# Patient Record
Sex: Female | Born: 1985 | Race: Black or African American | Hispanic: No | Marital: Married | Smoking: Never smoker
Health system: Southern US, Community
[De-identification: ages and names within clinical notes are randomized; demographics above are authoritative.]

## PROBLEM LIST (undated history)

## (undated) DIAGNOSIS — M2550 Pain in unspecified joint: Secondary | ICD-10-CM

## (undated) DIAGNOSIS — G43909 Migraine, unspecified, not intractable, without status migrainosus: Secondary | ICD-10-CM

## (undated) DIAGNOSIS — K859 Acute pancreatitis without necrosis or infection, unspecified: Secondary | ICD-10-CM

## (undated) HISTORY — PX: WISDOM TOOTH EXTRACTION: SHX21

## (undated) HISTORY — PX: NO PAST SURGERIES: SHX2092

---

## 2010-03-29 ENCOUNTER — Emergency Department (HOSPITAL_BASED_OUTPATIENT_CLINIC_OR_DEPARTMENT_OTHER): Admission: EM | Admit: 2010-03-29 | Discharge: 2010-03-29 | Payer: Self-pay | Admitting: Emergency Medicine

## 2010-07-31 LAB — URINE MICROSCOPIC-ADD ON

## 2010-07-31 LAB — URINALYSIS, ROUTINE W REFLEX MICROSCOPIC
Bilirubin Urine: NEGATIVE
Hgb urine dipstick: NEGATIVE
Ketones, ur: NEGATIVE mg/dL
Nitrite: NEGATIVE
Specific Gravity, Urine: 1.019 (ref 1.005–1.030)
Urobilinogen, UA: 1 mg/dL (ref 0.0–1.0)
pH: 6.5 (ref 5.0–8.0)

## 2010-07-31 LAB — DIFFERENTIAL
Basophils Absolute: 0.1 10*3/uL (ref 0.0–0.1)
Basophils Relative: 1 % (ref 0–1)
Eosinophils Absolute: 0.1 10*3/uL (ref 0.0–0.7)
Eosinophils Relative: 1 % (ref 0–5)
Lymphs Abs: 3.5 10*3/uL (ref 0.7–4.0)
Neutrophils Relative %: 31 % — ABNORMAL LOW (ref 43–77)

## 2010-07-31 LAB — COMPREHENSIVE METABOLIC PANEL
ALT: 22 U/L (ref 0–35)
AST: 21 U/L (ref 0–37)
Alkaline Phosphatase: 66 U/L (ref 39–117)
CO2: 25 mEq/L (ref 19–32)
Chloride: 108 mEq/L (ref 96–112)
GFR calc Af Amer: >60 mL/min (ref >60)
GFR calc non Af Amer: >60 mL/min (ref >60)
Glucose, Bld: 92 mg/dL (ref 70–99)
Potassium: 4.2 mEq/L (ref 3.5–5.1)
Sodium: 144 mEq/L (ref 135–145)
Total Bilirubin: 0.4 mg/dL (ref 0.3–1.2)

## 2010-07-31 LAB — LIPASE, BLOOD: Lipase: 363 U/L — ABNORMAL HIGH (ref 23–300)

## 2010-07-31 LAB — CBC
HCT: 39 % (ref 36.0–46.0)
Hemoglobin: 12.8 g/dL (ref 12.0–15.0)
MCHC: 32.8 g/dL (ref 30.0–36.0)
RBC: 4.51 MIL/uL (ref 3.87–5.11)

## 2010-07-31 LAB — PREGNANCY, URINE: Preg Test, Ur: NEGATIVE

## 2010-12-23 ENCOUNTER — Emergency Department (INDEPENDENT_AMBULATORY_CARE_PROVIDER_SITE_OTHER): Payer: Medicaid Other

## 2010-12-23 ENCOUNTER — Emergency Department (HOSPITAL_BASED_OUTPATIENT_CLINIC_OR_DEPARTMENT_OTHER)
Admission: EM | Admit: 2010-12-23 | Discharge: 2010-12-23 | Disposition: A | Discharge status: Home / Self Care | Payer: Medicaid Other | Attending: Emergency Medicine | Admitting: Emergency Medicine

## 2010-12-23 ENCOUNTER — Encounter: Payer: Self-pay | Admitting: *Deleted

## 2010-12-23 DIAGNOSIS — N760 Acute vaginitis: Secondary | ICD-10-CM | POA: Insufficient documentation

## 2010-12-23 DIAGNOSIS — I10 Essential (primary) hypertension: Secondary | ICD-10-CM | POA: Insufficient documentation

## 2010-12-23 DIAGNOSIS — R109 Unspecified abdominal pain: Secondary | ICD-10-CM | POA: Insufficient documentation

## 2010-12-23 DIAGNOSIS — Z3490 Encounter for supervision of normal pregnancy, unspecified, unspecified trimester: Secondary | ICD-10-CM

## 2010-12-23 DIAGNOSIS — J45909 Unspecified asthma, uncomplicated: Secondary | ICD-10-CM | POA: Insufficient documentation

## 2010-12-23 DIAGNOSIS — B9689 Other specified bacterial agents as the cause of diseases classified elsewhere: Secondary | ICD-10-CM | POA: Insufficient documentation

## 2010-12-23 DIAGNOSIS — O2390 Unspecified genitourinary tract infection in pregnancy, unspecified trimester: Secondary | ICD-10-CM | POA: Insufficient documentation

## 2010-12-23 DIAGNOSIS — O9989 Other specified diseases and conditions complicating pregnancy, childbirth and the puerperium: Secondary | ICD-10-CM

## 2010-12-23 DIAGNOSIS — A499 Bacterial infection, unspecified: Secondary | ICD-10-CM | POA: Insufficient documentation

## 2010-12-23 DIAGNOSIS — R1032 Left lower quadrant pain: Secondary | ICD-10-CM

## 2010-12-23 HISTORY — DX: Acute pancreatitis without necrosis or infection, unspecified: K85.90

## 2010-12-23 LAB — URINALYSIS, ROUTINE W REFLEX MICROSCOPIC
Ketones, ur: NEGATIVE mg/dL
Nitrite: NEGATIVE
Protein, ur: NEGATIVE mg/dL
pH: 6 (ref 5.0–8.0)

## 2010-12-23 LAB — WET PREP, GENITAL

## 2010-12-23 LAB — URINE MICROSCOPIC-ADD ON

## 2010-12-23 MED ORDER — METRONIDAZOLE 500 MG PO TABS: 500.0000 mg | ORAL_TABLET | Freq: Two times a day (BID) | ORAL | Status: AC

## 2010-12-23 NOTE — ED Notes (Signed)
L side abd pain started Thursday, felt better Friday ,but pain returned, has experienced some n/v

## 2010-12-23 NOTE — ED Provider Notes (Signed)
History     CSN: [id number] Arrival date & time: 12/23/2010  2:43 PM  Chief Complaint  Patient presents with  . Abdominal Pain   HPI Comments: Pt states that the pain has been intermittent:pt states that she hasn't had a period since BJY:NWGNFA any history of ovarian cyst   Patient is a 25 y.o. female presenting with abdominal pain. The history is provided by the patient. No language interpreter was used.  Abdominal Pain The primary symptoms of the illness include abdominal pain and dysuria. The primary symptoms of the illness do not include fever, nausea, vomiting, diarrhea, vaginal discharge or vaginal bleeding. The current episode started more than 2 days ago. The onset of the illness was sudden. The problem has not changed since onset. The dysuria is associated with frequency and urgency. The dysuria is not associated with hematuria.   The patient states that she believes she is currently not pregnant. The patient has not had a change in bowel habit. Additional symptoms associated with the illness include urgency and frequency. Symptoms associated with the illness do not include hematuria or back pain.    Past Medical History  Diagnosis Date  . Asthma   . Hypertension   . Pancreatitis     History reviewed. No pertinent past surgical history.  History reviewed. No pertinent family history.  History  Substance Use Topics  . Smoking status: Never Smoker   . Smokeless tobacco: Not on file  . Alcohol Use:     OB History    Grav Para Term Preterm Abortions TAB SAB Ect Mult Living                  Review of Systems  Constitutional: Negative for fever.  Gastrointestinal: Positive for abdominal pain. Negative for nausea, vomiting and diarrhea.  Genitourinary: Positive for dysuria, urgency and frequency. Negative for hematuria, vaginal bleeding and vaginal discharge.  Musculoskeletal: Negative for back pain.  All other systems reviewed and are negative.    Physical Exam    There were no vitals taken for this visit.  Physical Exam  Nursing note and vitals reviewed. Constitutional: She is oriented to person, place, and time. She appears well-developed and well-nourished.  HENT:  Head: Normocephalic and atraumatic.  Neck: Normal range of motion.  Cardiovascular: Normal rate and regular rhythm.   Pulmonary/Chest: Effort normal and breath sounds normal.  Abdominal: Soft. Bowel sounds are normal.  Genitourinary: Vaginal discharge found.       Pt has a white vag. discharge  Musculoskeletal: Normal range of motion.  Neurological: She is alert and oriented to person, place, and time.  Skin: Skin is warm and dry.  Psychiatric: She has a normal mood and affect.    ED Course  Procedures Results for orders placed during the hospital encounter of 12/23/10  URINALYSIS, ROUTINE W REFLEX MICROSCOPIC      Component Value Range   Color, Urine AMBER (*) YELLOW    Appearance CLOUDY (*) CLEAR    Specific Gravity, Urine 1.031 (*) 1.005 - 1.030    pH 6.0  5.0 - 8.0    Glucose, UA NEGATIVE  NEGATIVE (mg/dL)   Hgb urine dipstick NEGATIVE  NEGATIVE    Bilirubin Urine SMALL (*) NEGATIVE    Ketones, ur NEGATIVE  NEGATIVE (mg/dL)   Protein, ur NEGATIVE  NEGATIVE (mg/dL)   Urobilinogen, UA 1.0  0.0 - 1.0 (mg/dL)   Nitrite NEGATIVE  NEGATIVE    Leukocytes, UA TRACE (*) NEGATIVE   PREGNANCY, URINE  Component Value Range   Preg Test, Ur POSITIVE    WET PREP, GENITAL      Component Value Range   Yeast, Wet Prep NONE SEEN  NONE SEEN    Trich, Wet Prep NONE SEEN  NONE SEEN    Clue Cells, Wet Prep TOO NUMEROUS TO COUNT (*) NONE SEEN    WBC, Wet Prep HPF POC TOO NUMEROUS TO COUNT (*) NONE SEEN   URINE MICROSCOPIC-ADD ON      Component Value Range   Squamous Epithelial / LPF MANY (*) RARE    WBC, UA 3-6  <3 (WBC/hpf)   Bacteria, UA MANY (*) RARE    Urine-Other MUCOUS PRESENT     US Ob Comp Less 14 Wks  12/23/2010  *RADIOLOGY REPORT*  Clinical Data: Left lower  quadrant pain.  Positive pregnancy test. By LMP the patient is 10 weeks 1 day.  Unsure LMP.  EDC by LMP 07/20/2011.  OBSTETRIC <14 WK [place] AND TRANSVAGINAL OB US  Technique:  Both transabdominal and transvaginal ultrasound examinations were performed for complete evaluation of the gestation as well as the maternal uterus, adnexal regions, and pelvic cul-de-sac.  Transvaginal technique was performed to assess early pregnancy.  Comparison:  None.  Intrauterine gestational sac:  Present Yolk sac: Present Embryo: Present Cardiac Activity: Present Heart Rate: 160 bpm  CRL: 10.9   mm  7   w  1   d        [place] EDC: 08/10/2011  Maternal uterus/adnexae: The ovaries have a normal appearance.  No subchorionic hemorrhage identified. Right ovarian corpus luteum cyst present.  IMPRESSION: The single living intrauterine embryo corresponding to an age of 7 weeks 1 day.  This is significantly differ from age determined by last menstrual period.  Ovaries have a normal appearance.  No subchorionic hemorrhage identified.  EDC by ultrasound today is 08/10/2011.  Original Report Authenticated By: Patterson Hammersmith, M.D.   US Ob Transvaginal  12/23/2010  *RADIOLOGY REPORT*  Clinical Data: Left lower quadrant pain.  Positive pregnancy test. By LMP the patient is 10 weeks 1 day.  Unsure LMP.  EDC by LMP 07/20/2011.  OBSTETRIC <14 WK [place] AND TRANSVAGINAL OB US  Technique:  Both transabdominal and transvaginal ultrasound examinations were performed for complete evaluation of the gestation as well as the maternal uterus, adnexal regions, and pelvic cul-de-sac.  Transvaginal technique was performed to assess early pregnancy.  Comparison:  None.  Intrauterine gestational sac:  Present Yolk sac: Present Embryo: Present Cardiac Activity: Present Heart Rate: 160 bpm  CRL: 10.9   mm  7   w  1   d        [place] EDC: 08/10/2011  Maternal uterus/adnexae: The ovaries have a normal appearance.  No subchorionic hemorrhage identified. Right ovarian corpus luteum  cyst present.  IMPRESSION: The single living intrauterine embryo corresponding to an age of 7 weeks 1 day.  This is significantly differ from age determined by last menstrual period.  Ovaries have a normal appearance.  No subchorionic hemorrhage identified.  EDC by ultrasound today is 08/10/2011.  Original Report Authenticated By: Patterson Hammersmith, M.D.    MDM Pt has ZOX:WRUE treat for AV:[id number] for sti and urine sent   Medical screening examination/treatment/procedure(s) were performed by non-physician practitioner and as supervising physician I was immediately available for consultation/collaboration. Osvaldo Human, M.D.  Teressa Lower, NP 12/23/10 1712  Carleene Cooper III, MD 12/24/10 2017

## 2010-12-24 LAB — URINE CULTURE
Colony Count: NO GROWTH
Culture  Setup Time: 201208060014

## 2010-12-24 LAB — GC/CHLAMYDIA PROBE AMP, GENITAL: Chlamydia, DNA Probe: NEGATIVE

## 2010-12-28 ENCOUNTER — Encounter (HOSPITAL_COMMUNITY): Payer: Self-pay | Admitting: *Deleted

## 2010-12-28 ENCOUNTER — Inpatient Hospital Stay (HOSPITAL_COMMUNITY)
Admission: AD | Admit: 2010-12-28 | Discharge: 2010-12-29 | Disposition: A | Discharge status: Home / Self Care | Payer: Medicaid Other | Source: Ambulatory Visit | Attending: Family Medicine | Admitting: Family Medicine

## 2010-12-28 DIAGNOSIS — O99891 Other specified diseases and conditions complicating pregnancy: Secondary | ICD-10-CM | POA: Insufficient documentation

## 2010-12-28 DIAGNOSIS — R109 Unspecified abdominal pain: Secondary | ICD-10-CM | POA: Insufficient documentation

## 2010-12-28 DIAGNOSIS — O26899 Other specified pregnancy related conditions, unspecified trimester: Secondary | ICD-10-CM

## 2010-12-28 HISTORY — DX: Pain in unspecified joint: M25.50

## 2010-12-28 NOTE — Progress Notes (Signed)
Pt states, " I started having pain in my lower left abdomen off and on since last Thursday a week ago. It happened again over the week end, and I went to [facility], and they said I was pregnant, but had BV. They did an [place] and said I had a normal pregnancy. I started having this pain again an hour ago."

## 2010-12-29 ENCOUNTER — Encounter (HOSPITAL_COMMUNITY): Payer: Self-pay

## 2010-12-29 LAB — URINALYSIS, ROUTINE W REFLEX MICROSCOPIC
Ketones, ur: NEGATIVE mg/dL
Nitrite: NEGATIVE
Protein, ur: NEGATIVE mg/dL
pH: 6 (ref 5.0–8.0)

## 2010-12-29 LAB — URINE MICROSCOPIC-ADD ON

## 2010-12-29 MED ORDER — ACETAMINOPHEN 325 MG PO TABS
650.0000 mg | ORAL_TABLET | Freq: Once | ORAL | Status: AC
  Administered 2010-12-29: 650 mg via ORAL
  Filled 2010-12-29: qty 2

## 2010-12-29 MED ORDER — PRENATAL RX 60-1 MG PO TABS: 1.0000 | ORAL_TABLET | Freq: Every day | ORAL | Status: AC

## 2010-12-29 NOTE — Progress Notes (Signed)
Patient is here with c/o that she had to leave work at 2230pm due to sudden onset of sharp stabbing pain in her lower abdominal. She denies any vaginal bleeding or discharge.

## 2010-12-29 NOTE — ED Provider Notes (Addendum)
History     Chief Complaint  Patient presents with  . Abdominal Pain   HPI Kendra Graham 25 y.o. gestation 46w 0d by US done on 12-23-10.  Had full workup done on 12-23-10 due to periodic stabbing lower abdominal pain.  Same pain returned tonight while she was at work.  Has not taken any Tylenol.  Has not started prenatal care.    OB History    Grav Para Term Preterm Abortions TAB SAB Ect Mult Living   1               Past Medical History  Diagnosis Date  . Asthma   . Hypertension   . Pancreatitis   . Arthritic-like pain     History reviewed. No pertinent past surgical history.  Family History  Problem Relation Age of Onset  . Diabetes Mother   . Hyperlipidemia Mother   . Hypertension Mother   . Cancer Father   . Cancer Maternal Grandmother     History  Substance Use Topics  . Smoking status: Never Smoker   . Smokeless tobacco: Not on file  . Alcohol Use: 0.6 oz/week    1 Shots of liquor per week    Allergies:  Allergies  Allergen Reactions  . Vinyl Ether Hives and Rash    Prescriptions prior to admission  Medication Sig Dispense Refill  . metroNIDAZOLE (FLAGYL) 500 MG tablet Take 1 tablet (500 mg total) by mouth 2 (two) times daily.  14 tablet  0    Review of Systems  Gastrointestinal: Positive for abdominal pain.  Genitourinary: Negative for dysuria and urgency.       No vaginal discharge. No vaginal bleeding. No dysuria.    Physical Exam   Blood pressure 123/77, pulse 63, temperature 98.5 F (36.9 C), temperature source Oral, resp. rate 18, height 5\' 2"  (1.575 m), weight 102 lb (46.267 kg), last menstrual period 10/16/2010.  Physical Exam  Nursing note and vitals reviewed. Constitutional: She is oriented to person, place, and time. She appears well-developed and well-nourished.  HENT:  Head: Normocephalic.  Eyes: EOM are normal.  Neck: Neck supple.  GI: Soft. There is no rebound and no guarding.       Low midline tenderness    Musculoskeletal: Normal range of motion.  Neurological: She is alert and oriented to person, place, and time.  Skin: Skin is warm and dry.  Psychiatric: She has a normal mood and affect.    MAU Course  Procedures  MDM Results for orders placed during the hospital encounter of 12/28/10 (from the past 24 hour(s))  URINALYSIS, ROUTINE W REFLEX MICROSCOPIC     Status: Abnormal   Collection Time   12/29/10 12:24 AM      Component Value Range   Color, Urine YELLOW  YELLOW    Appearance CLEAR  CLEAR    Specific Gravity, Urine <1.005 (*) 1.005 - 1.030    pH 6.0  5.0 - 8.0    Glucose, UA NEGATIVE  NEGATIVE (mg/dL)   Hgb urine dipstick NEGATIVE  NEGATIVE    Bilirubin Urine NEGATIVE  NEGATIVE    Ketones, ur NEGATIVE  NEGATIVE (mg/dL)   Protein, ur NEGATIVE  NEGATIVE (mg/dL)   Urobilinogen, UA 0.2  0.0 - 1.0 (mg/dL)   Nitrite NEGATIVE  NEGATIVE    Leukocytes, UA MODERATE (*) NEGATIVE   URINE MICROSCOPIC-ADD ON     Status: Abnormal   Collection Time   12/29/10 12:24 AM      Component Value  Range   Squamous Epithelial / LPF FEW (*) RARE    WBC, UA 3-6  <3 (WBC/hpf)   RBC / HPF 0-2  <3 (RBC/hpf)   Bacteria, UA FEW (*) RARE    *RADIOLOGY REPORT* Done 12-23-10 Clinical Data: Left lower quadrant pain. Positive pregnancy test.  By LMP the patient is 10 weeks 1 day. Unsure LMP. EDC by LMP  07/20/2011.  OBSTETRIC <14 WK [place] AND TRANSVAGINAL OB US  Technique: Both transabdominal and transvaginal ultrasound  examinations were performed for complete evaluation of the  gestation as well as the maternal uterus, adnexal regions, and  pelvic cul-de-sac. Transvaginal technique was performed to assess  early pregnancy.  Comparison: None.  Intrauterine gestational sac: Present  Yolk sac: Present  Embryo: Present  Cardiac Activity: Present  Heart Rate: 160 bpm  CRL: 10.9 mm 7 w 1 d [place] EDC: 08/10/2011  Maternal uterus/adnexae:  The ovaries have a normal appearance. No subchorionic hemorrhage   identified. Right ovarian corpus luteum cyst present.  IMPRESSION:  The single living intrauterine embryo corresponding to an age of 7  weeks 1 day. This is significantly differ from age determined by  last menstrual period. Ovaries have a normal appearance. No  subchorionic hemorrhage identified. EDC by ultrasound today is  08/10/2011.   Assessment and Plan  Abdominal pain in early pregnancy  Plan: Urine culture pending to R/O UTI Tylenol 650 mg PO now. Continue good fluid intake. Begin prenatal care as soon as possible.  BURLESON,TERRI 12/29/2010, 1:01 AM   Nolene Bernheim, NP 12/29/10 0121  Chart reviewed and agree with management and plan.

## 2010-12-29 NOTE — Progress Notes (Signed)
She states that she found out that she was pregnant this week at [facility].

## 2010-12-30 LAB — URINE CULTURE
Colony Count: NO GROWTH
Culture  Setup Time: 201208111242

## 2011-03-21 NOTE — ED Provider Notes (Signed)
thoracic spine

## 2012-07-20 ENCOUNTER — Emergency Department (HOSPITAL_BASED_OUTPATIENT_CLINIC_OR_DEPARTMENT_OTHER)
Admission: EM | Admit: 2012-07-20 | Discharge: 2012-07-20 | Disposition: A | Discharge status: Home / Self Care | Payer: Medicaid Other | Attending: Emergency Medicine | Admitting: Emergency Medicine

## 2012-07-20 ENCOUNTER — Emergency Department (HOSPITAL_BASED_OUTPATIENT_CLINIC_OR_DEPARTMENT_OTHER): Payer: Medicaid Other

## 2012-07-20 ENCOUNTER — Emergency Department (HOSPITAL_COMMUNITY): Payer: Medicaid Other

## 2012-07-20 ENCOUNTER — Encounter (HOSPITAL_BASED_OUTPATIENT_CLINIC_OR_DEPARTMENT_OTHER): Payer: Self-pay | Admitting: *Deleted

## 2012-07-20 DIAGNOSIS — J45909 Unspecified asthma, uncomplicated: Secondary | ICD-10-CM | POA: Insufficient documentation

## 2012-07-20 DIAGNOSIS — S0990XA Unspecified injury of head, initial encounter: Secondary | ICD-10-CM | POA: Insufficient documentation

## 2012-07-20 DIAGNOSIS — W108XXA Fall (on) (from) other stairs and steps, initial encounter: Secondary | ICD-10-CM | POA: Insufficient documentation

## 2012-07-20 DIAGNOSIS — Z8719 Personal history of other diseases of the digestive system: Secondary | ICD-10-CM | POA: Insufficient documentation

## 2012-07-20 DIAGNOSIS — Y92009 Unspecified place in unspecified non-institutional (private) residence as the place of occurrence of the external cause: Secondary | ICD-10-CM

## 2012-07-20 DIAGNOSIS — S7002XA Contusion of left hip, initial encounter: Secondary | ICD-10-CM

## 2012-07-20 DIAGNOSIS — Y9289 Other specified places as the place of occurrence of the external cause: Secondary | ICD-10-CM | POA: Insufficient documentation

## 2012-07-20 DIAGNOSIS — Y9389 Activity, other specified: Secondary | ICD-10-CM | POA: Insufficient documentation

## 2012-07-20 DIAGNOSIS — S7000XA Contusion of unspecified hip, initial encounter: Secondary | ICD-10-CM | POA: Insufficient documentation

## 2012-07-20 DIAGNOSIS — I10 Essential (primary) hypertension: Secondary | ICD-10-CM | POA: Insufficient documentation

## 2012-07-20 DIAGNOSIS — W19XXXA Unspecified fall, initial encounter: Secondary | ICD-10-CM

## 2012-07-20 MED ORDER — OXYCODONE-ACETAMINOPHEN 5-325 MG PO TABS
2.0000 | ORAL_TABLET | Freq: Once | ORAL | Status: AC
  Administered 2012-07-20: 2 via ORAL
  Filled 2012-07-20 (×2): qty 2

## 2012-07-20 NOTE — ED Notes (Signed)
Pt to room 3 in w/c, able to stand and walk to bed with cg@. Pt reports "I missed a step this morning, and fell down my steps..." pt states she hit her left hip and leg, and hit her posterior head on steps. No lacerations, abrasions, or swelling noted to head, slightly tender to palp.

## 2012-07-20 NOTE — ED Provider Notes (Signed)
History     CSN: [id number]  Arrival date & time 07/20/12  2595   First MD Initiated Contact with Patient 07/20/12 [phone]      Chief Complaint  Patient presents with  . Fall  . Headache    (Consider location/radiation/quality/duration/timing/severity/associated sxs/prior treatment) HPI Kendra Graham who comes in today stating that she fell down the steps approximately one and half hours prior to admission. She states it is in a third floor apartment and fell at least part of the way down to the second floor landing. It was a trip and fall without loss of consciousness. She states she struck her head and has a headache. She is also complaining of some pain in her left hip. He she is unclear she had loss of consciousness and she was alone. She called her sister to come and get her. She has been ambulatory since. She denies any numbness, tingling, weakness, or focal neurological deficit. She has normal vision and has not vomited. Past Medical History  Diagnosis Date  . Asthma   . Hypertension   . Pancreatitis   . Arthritic-like pain     History reviewed. No pertinent past surgical history.  Family History  Problem Relation Age of Onset  . Diabetes Mother   . Hyperlipidemia Mother   . Hypertension Mother   . Cancer Father   . Cancer Maternal Grandmother     History  Substance Use Topics  . Smoking status: Never Smoker   . Smokeless tobacco: Not on file  . Alcohol Use: 0.6 oz/week    1 Shots of liquor per week    OB History   Grav Para Term Preterm Abortions TAB SAB Ect Mult Living   1               Review of Systems  All other systems reviewed and are negative.    Allergies  Vinyl ether  Home Medications  No current outpatient prescriptions on file.  BP 132/81  Pulse 84  Temp(Src) 97.8 F (36.6 C) (Oral)  Resp 16  SpO2 100%  LMP 07/19/2012  Breastfeeding? Unknown  Physical Exam  Nursing note and vitals reviewed. Constitutional: She is oriented to  person, place, and time. She appears well-developed and well-nourished.  HENT:  Head: Normocephalic and atraumatic.  Right Ear: Tympanic membrane, external ear and ear canal normal.  Left Ear: Tympanic membrane, external ear and ear canal normal.  Nose: Nose normal.  Mouth/Throat: Oropharynx is clear and moist.  Eyes: Conjunctivae and EOM are normal. Pupils are equal, round, and reactive to light.  Neck:  Some tenderness noted diffusely over her superior cervical spine  Cardiovascular: Normal rate, regular rhythm, normal heart sounds and intact distal pulses.   Pulmonary/Chest: Effort normal and breath sounds normal.  Abdominal: Soft. Bowel sounds are normal.  Musculoskeletal:  Thoracic and lumbar spine and paraspinal area without tenderness. No signs of trauma on trunk or extremities. She is slightly tender to palpation over the lateral aspect of the left hip and hasn't and Hodge date secondary to this.  Neurological: She is alert and oriented to person, place, and time. She has normal strength and normal reflexes. She displays a negative Romberg sign. Gait abnormal. Coordination normal. GCS eye subscore is 4. GCS verbal subscore is 5. GCS motor subscore is 6.    ED Course  Procedures (including critical care time)  Labs Reviewed - No data to display No results found.   No diagnosis found. Dg Hip Complete Left  07/20/2012  *RADIOLOGY REPORT*  Clinical Data: Fall.  Headache.  LEFT HIP - COMPLETE 2+ VIEW  Comparison: None.  Findings: No acute fracture and no dislocation.  Unremarkable soft tissues.  IMPRESSION: No acute bony pathology.   Original Report Authenticated By: Jolaine Click, M.D.    Ct Head Wo Contrast  07/20/2012  *RADIOLOGY REPORT*  Clinical Data:  Fall down stairs with posterior head injury and posterior neck pain.  CT HEAD WITHOUT CONTRAST CT CERVICAL SPINE WITHOUT CONTRAST  Technique:  Multidetector CT imaging of the head and cervical spine was performed following the  standard protocol without intravenous contrast.  Multiplanar CT image reconstructions of the cervical spine were also generated.  Comparison:   None  CT HEAD  Findings: The brain demonstrates no evidence of hemorrhage, infarction, edema, mass effect, extra-axial fluid collection, hydrocephalus or mass lesion.  The skull is unremarkable.  IMPRESSION: Normal head CT.  CT CERVICAL SPINE  Findings: The cervical spine shows normal alignment and no evidence of fracture or subluxation.  No soft tissue swelling is identified. The visualized airway is unremarkable.  No significant degenerative changes.  No incidental masses or bony lesions.  IMPRESSION: Normal CT of cervical spine.   Original Report Authenticated By: Irish Lack, M.D.    Ct Cervical Spine Wo Contrast  07/20/2012  *RADIOLOGY REPORT*  Clinical Data:  Fall down stairs with posterior head injury and posterior neck pain.  CT HEAD WITHOUT CONTRAST CT CERVICAL SPINE WITHOUT CONTRAST  Technique:  Multidetector CT imaging of the head and cervical spine was performed following the standard protocol without intravenous contrast.  Multiplanar CT image reconstructions of the cervical spine were also generated.  Comparison:   None  CT HEAD  Findings: The brain demonstrates no evidence of hemorrhage, infarction, edema, mass effect, extra-axial fluid collection, hydrocephalus or mass lesion.  The skull is unremarkable.  IMPRESSION: Normal head CT.  CT CERVICAL SPINE  Findings: The cervical spine shows normal alignment and no evidence of fracture or subluxation.  No soft tissue swelling is identified. The visualized airway is unremarkable.  No significant degenerative changes.  No incidental masses or bony lesions.  IMPRESSION: Normal CT of cervical spine.   Original Report Authenticated By: Irish Lack, M.D.    MDM  Patient will have CT of head and neck x-ray of left hip. She is given 2 Percocet here for pain. CT is negative and x-ray of left hip is negative.  Patient is given head injury precautions. She's advised return if she has any symptoms of increased weakness, increased drowsiness decreased arousability, difficulty with gait. Learta Codding, MD 07/20/12 [phone]

## 2013-04-12 ENCOUNTER — Encounter (HOSPITAL_BASED_OUTPATIENT_CLINIC_OR_DEPARTMENT_OTHER): Payer: Self-pay | Admitting: Emergency Medicine

## 2013-04-12 ENCOUNTER — Emergency Department (HOSPITAL_BASED_OUTPATIENT_CLINIC_OR_DEPARTMENT_OTHER)
Admission: EM | Admit: 2013-04-12 | Discharge: 2013-04-12 | Disposition: A | Discharge status: Home / Self Care | Payer: Medicaid Other | Attending: Emergency Medicine | Admitting: Emergency Medicine

## 2013-04-12 DIAGNOSIS — R197 Diarrhea, unspecified: Secondary | ICD-10-CM | POA: Insufficient documentation

## 2013-04-12 DIAGNOSIS — Z8719 Personal history of other diseases of the digestive system: Secondary | ICD-10-CM | POA: Insufficient documentation

## 2013-04-12 DIAGNOSIS — R1031 Right lower quadrant pain: Secondary | ICD-10-CM | POA: Insufficient documentation

## 2013-04-12 DIAGNOSIS — J45909 Unspecified asthma, uncomplicated: Secondary | ICD-10-CM | POA: Insufficient documentation

## 2013-04-12 DIAGNOSIS — Z3202 Encounter for pregnancy test, result negative: Secondary | ICD-10-CM | POA: Insufficient documentation

## 2013-04-12 LAB — URINALYSIS, ROUTINE W REFLEX MICROSCOPIC
Bilirubin Urine: NEGATIVE
Glucose, UA: NEGATIVE mg/dL
Ketones, ur: NEGATIVE mg/dL
Nitrite: NEGATIVE
Specific Gravity, Urine: 1.021 (ref 1.005–1.030)
pH: 5.5 (ref 5.0–8.0)

## 2013-04-12 LAB — URINE MICROSCOPIC-ADD ON

## 2013-04-12 NOTE — ED Notes (Signed)
C/o of right lower abd pain that started about 2 hours ago. States pain comes and goes. Describes as sharp.  C/o diarrhea times one. Denies fevers. Denies any n/v. Denies any vaginal d/c.

## 2013-04-12 NOTE — ED Provider Notes (Signed)
CSN: [id number]     Arrival date & time 04/12/13  8469 History   First MD Initiated Contact with Patient 04/12/13 0700     Chief Complaint  Patient presents with  . Abdominal Pain   (Consider location/radiation/quality/duration/timing/severity/associated sxs/prior Treatment) Patient is a 27 y.o. female presenting with abdominal pain. The history is provided by the patient.  Abdominal Pain Pain location:  RLQ Pain quality: sharp   Pain radiates to:  Does not radiate Pain severity:  Moderate Onset quality:  Sudden Duration:  2 hours Timing:  Intermittent Progression:  Resolved Chronicity:  New Context: not alcohol use, not diet changes, not eating, not medication withdrawal, not previous surgeries, not recent illness, not recent travel, not retching, not sick contacts, not suspicious food intake and not trauma   Relieved by:  Nothing Worsened by:  Nothing tried Ineffective treatments:  None tried Associated symptoms: diarrhea   Associated symptoms: no chest pain, no chills, no cough, no dysuria, no fatigue, no fever, no hematuria, no nausea, no shortness of breath, no sore throat, no vaginal bleeding, no vaginal discharge and no vomiting   Diarrhea:    Number of occurrences:  1   Severity:  Mild   Duration:  1 hour   Timing:  Rare   Progression:  Unchanged Risk factors: no alcohol abuse, has not had multiple surgeries, no NSAID use, not obese, not pregnant and no recent hospitalization     Past Medical History  Diagnosis Date  . Asthma   . Pancreatitis   . Arthritic-like pain    History reviewed. No pertinent past surgical history. Family History  Problem Relation Age of Onset  . Diabetes Mother   . Hyperlipidemia Mother   . Hypertension Mother   . Cancer Father   . Cancer Maternal Grandmother    History  Substance Use Topics  . Smoking status: Never Smoker   . Smokeless tobacco: Not on file  . Alcohol Use: No   OB History   Grav Para Term Preterm Abortions TAB  SAB Ect Mult Living   1              Review of Systems  Constitutional: Negative for fever, chills, diaphoresis, activity change, appetite change and fatigue.  HENT: Negative for congestion, facial swelling, rhinorrhea and sore throat.   Eyes: Negative for photophobia and discharge.  Respiratory: Negative for cough, chest tightness and shortness of breath.   Cardiovascular: Negative for chest pain, palpitations and leg swelling.  Gastrointestinal: Positive for abdominal pain and diarrhea. Negative for nausea and vomiting.  Endocrine: Negative for polydipsia and polyuria.  Genitourinary: Negative for dysuria, frequency, hematuria, vaginal bleeding, vaginal discharge, difficulty urinating and pelvic pain.  Musculoskeletal: Negative for arthralgias, back pain, neck pain and neck stiffness.  Skin: Negative for color change and wound.  Allergic/Immunologic: Negative for immunocompromised state.  Neurological: Negative for facial asymmetry, weakness, numbness and headaches.  Hematological: Does not bruise/bleed easily.  Psychiatric/Behavioral: Negative for confusion and agitation.    Allergies  Vinyl ether  Home Medications  No current outpatient prescriptions on file. BP 114/73  Pulse 69  Temp(Src) 98.6 F (37 C) (Oral)  Resp 16  SpO2 100%  LMP 03/07/2013 Physical Exam  Constitutional: She is oriented to person, place, and time. She appears well-developed and well-nourished. No distress.  HENT:  Head: Normocephalic and atraumatic.  Mouth/Throat: No oropharyngeal exudate.  Eyes: Pupils are equal, round, and reactive to light.  Neck: Normal range of motion. Neck supple.  Cardiovascular: Normal  rate, regular rhythm and normal heart sounds.  Exam reveals no gallop and no friction rub.   No murmur heard. Pulmonary/Chest: Effort normal and breath sounds normal. No respiratory distress. She has no wheezes. She has no rales.  Abdominal: Soft. Bowel sounds are normal. She exhibits no  distension and no mass. There is no tenderness. There is no rebound and no guarding.  Musculoskeletal: Normal range of motion. She exhibits no edema and no tenderness.  Neurological: She is alert and oriented to person, place, and time.  Skin: Skin is warm and dry.  Psychiatric: She has a normal mood and affect.    ED Course  Procedures (including critical care time) Labs Review Labs Reviewed  URINALYSIS, ROUTINE W REFLEX MICROSCOPIC  PREGNANCY, URINE   Imaging Review No results found.  EKG Interpretation   None       MDM  No diagnosis found. Pt is a 27 y.o. female with Pmhx as above who presents with about 2 hours of intermittent RLQ pain.  No pain currently for last 20 mins.  Had 1 episode of d/a this morning. No fever, n/v, urinary symptoms, vag bleeding or d/c.  LMP about 1 month ago.  On PE VSS, pt in NAD. Abdominal exam benign.  Given reasuring exam, doubt acute surgical emergency such as appendicitis, ovarian torsion.  UA + only for small leuks, POC preg negative.  I feel pt safe for d/c home w/ return precautions for worsening or persistent pain.           Shanna Cisco, MD 04/12/13 [phone]

## 2013-04-13 LAB — URINE CULTURE: Colony Count: >=100000

## 2014-03-21 ENCOUNTER — Encounter (HOSPITAL_BASED_OUTPATIENT_CLINIC_OR_DEPARTMENT_OTHER): Payer: Self-pay | Admitting: Emergency Medicine

## 2014-06-13 ENCOUNTER — Encounter (HOSPITAL_BASED_OUTPATIENT_CLINIC_OR_DEPARTMENT_OTHER): Payer: Self-pay | Admitting: *Deleted

## 2014-06-13 ENCOUNTER — Emergency Department (HOSPITAL_BASED_OUTPATIENT_CLINIC_OR_DEPARTMENT_OTHER)
Admission: EM | Admit: 2014-06-13 | Discharge: 2014-06-13 | Disposition: A | Discharge status: Home / Self Care | Payer: Medicaid Other | Attending: Emergency Medicine | Admitting: Emergency Medicine

## 2014-06-13 ENCOUNTER — Emergency Department (HOSPITAL_BASED_OUTPATIENT_CLINIC_OR_DEPARTMENT_OTHER): Payer: Medicaid Other

## 2014-06-13 DIAGNOSIS — W009XXA Unspecified fall due to ice and snow, initial encounter: Secondary | ICD-10-CM | POA: Diagnosis not present

## 2014-06-13 DIAGNOSIS — W19XXXA Unspecified fall, initial encounter: Secondary | ICD-10-CM

## 2014-06-13 DIAGNOSIS — S4991XA Unspecified injury of right shoulder and upper arm, initial encounter: Secondary | ICD-10-CM | POA: Insufficient documentation

## 2014-06-13 DIAGNOSIS — M545 Low back pain, unspecified: Secondary | ICD-10-CM

## 2014-06-13 DIAGNOSIS — Y998 Other external cause status: Secondary | ICD-10-CM | POA: Insufficient documentation

## 2014-06-13 DIAGNOSIS — Y9389 Activity, other specified: Secondary | ICD-10-CM | POA: Diagnosis not present

## 2014-06-13 DIAGNOSIS — J45909 Unspecified asthma, uncomplicated: Secondary | ICD-10-CM | POA: Insufficient documentation

## 2014-06-13 DIAGNOSIS — Z8719 Personal history of other diseases of the digestive system: Secondary | ICD-10-CM | POA: Diagnosis not present

## 2014-06-13 DIAGNOSIS — Z8739 Personal history of other diseases of the musculoskeletal system and connective tissue: Secondary | ICD-10-CM | POA: Diagnosis not present

## 2014-06-13 DIAGNOSIS — M25511 Pain in right shoulder: Secondary | ICD-10-CM

## 2014-06-13 DIAGNOSIS — Y9289 Other specified places as the place of occurrence of the external cause: Secondary | ICD-10-CM | POA: Diagnosis not present

## 2014-06-13 DIAGNOSIS — S3992XA Unspecified injury of lower back, initial encounter: Secondary | ICD-10-CM | POA: Insufficient documentation

## 2014-06-13 MED ORDER — IBUPROFEN 800 MG PO TABS
800.0000 mg | ORAL_TABLET | Freq: Once | ORAL | Status: AC
  Administered 2014-06-13: 800 mg via ORAL
  Filled 2014-06-13: qty 1

## 2014-06-13 MED ORDER — CYCLOBENZAPRINE HCL 10 MG PO TABS
5.0000 mg | ORAL_TABLET | Freq: Once | ORAL | Status: AC
  Administered 2014-06-13: 5 mg via ORAL
  Filled 2014-06-13: qty 1

## 2014-06-13 NOTE — Discharge Instructions (Signed)
Return to the emergency room with worsening of symptoms, new symptoms or with symptoms that are concerning , especially fevers, loss of control of bladder or bowels, numbness or tingling around genital region or anus, weakness. RICE: Rest, Ice (three cycles of 20 mins on, 8mins off at least twice a day), compression/brace, elevation. Heating pad works well for back pain. Ibuprofen $RemoveBeforeD'400mg'XmeIgraPSMkGrf$  (2 tablets $RemoveBe'200mg'KyenRlovX$ ) every 5-6 hours for 3-5 days. Follow up with PCP if symptoms worsen or are persistent.  Shoulder Pain The shoulder is the joint that connects your arms to your body. The bones that form the shoulder joint include the upper arm bone (humerus), the shoulder blade (scapula), and the collarbone (clavicle). The top of the humerus is shaped like a ball and fits into a rather flat socket on the scapula (glenoid cavity). A combination of muscles and strong, fibrous tissues that connect muscles to bones (tendons) support your shoulder joint and hold the ball in the socket. Small, fluid-filled sacs (bursae) are located in different areas of the joint. They act as cushions between the bones and the overlying soft tissues and help reduce friction between the gliding tendons and the bone as you move your arm. Your shoulder joint allows a wide range of motion in your arm. This range of motion allows you to do things like scratch your back or throw a ball. However, this range of motion also makes your shoulder more prone to pain from overuse and injury. Causes of shoulder pain can originate from both injury and overuse and usually can be grouped in the following four categories:  Redness, swelling, and pain (inflammation) of the tendon (tendinitis) or the bursae (bursitis).  Instability, such as a dislocation of the joint.  Inflammation of the joint (arthritis).  Broken bone (fracture). HOME CARE INSTRUCTIONS   Apply ice to the sore area.  Put ice in a plastic bag.  Place a towel between your skin and the  bag.  Leave the ice on for 15-20 minutes, 3-4 times per day for the first 2 days, or as directed by your health care provider.  Stop using cold packs if they do not help with the pain.  If you have a shoulder sling or immobilizer, wear it as long as your caregiver instructs. Only remove it to shower or bathe. Move your arm as little as possible, but keep your hand moving to prevent swelling.  Squeeze a soft ball or foam pad as much as possible to help prevent swelling.  Only take over-the-counter or prescription medicines for pain, discomfort, or fever as directed by your caregiver. SEEK MEDICAL CARE IF:   Your shoulder pain increases, or new pain develops in your arm, hand, or fingers.  Your hand or fingers become cold and numb.  Your pain is not relieved with medicines. SEEK IMMEDIATE MEDICAL CARE IF:   Your arm, hand, or fingers are numb or tingling.  Your arm, hand, or fingers are significantly swollen or turn white or blue. MAKE SURE YOU:   Understand these instructions.  Will watch your condition.  Will get help right away if you are not doing well or get worse. Document Released: 02/13/2005 Document Revised: 09/20/2013 Document Reviewed: 04/20/2011 [facility] Patient Information 2015 [place], [place]. This information is not intended to replace advice given to you by your health care provider. Make sure you discuss any questions you have with your health care provider.  Back Injury Prevention Back injuries can be extremely painful and difficult to heal. After having one  back injury, you are much more likely to experience another later on. It is important to learn how to avoid injuring or re-injuring your back. The following tips can help you to prevent a back injury. PHYSICAL FITNESS  Exercise regularly and try to develop good tone in your abdominal muscles. Your abdominal muscles provide a lot of the support needed by your back.  Do aerobic exercises (walking, jogging,  biking, swimming) regularly.  Do exercises that increase balance and strength (tai chi, yoga) regularly. This can decrease your risk of falling and injuring your back.  Stretch before and after exercising.  Maintain a healthy weight. The more you weigh, the more stress is placed on your back. For every pound of weight, 10 times that amount of pressure is placed on the back. DIET  Talk to your caregiver about how much calcium and vitamin D you need per day. These nutrients help to prevent weakening of the bones (osteoporosis). Osteoporosis can cause broken (fractured) bones that lead to back pain.  Include good sources of calcium in your diet, such as dairy products, green, leafy vegetables, and products with calcium added (fortified).  Include good sources of vitamin D in your diet, such as milk and foods that are fortified with vitamin D.  Consider taking a nutritional supplement or a multivitamin if needed.  Stop smoking if you smoke. POSTURE  Sit and stand up straight. Avoid leaning forward when you sit or hunching over when you stand.  Choose chairs with good low back (lumbar) support.  If you work at a desk, sit close to your work so you do not need to lean over. Keep your chin tucked in. Keep your neck drawn back and elbows bent at a right angle. Your arms should look like the letter "L."  Sit high and close to the steering wheel when you drive. Add a lumbar support to your car seat if needed.  Avoid sitting or standing in one position for too long. Take breaks to get up, stretch, and walk around at least once every hour. Take breaks if you are driving for long periods of time.  Sleep on your side with your knees slightly bent, or sleep on your back with a pillow under your knees. Do not sleep on your stomach. LIFTING, TWISTING, AND REACHING  Avoid heavy lifting, especially repetitive lifting. If you must do heavy lifting:  Stretch before lifting.  Work slowly.  Rest  between lifts.  Use carts and dollies to move objects when possible.  Make several small trips instead of carrying 1 heavy load.  Ask for help when you need it.  Ask for help when moving big, awkward objects.  Follow these steps when lifting:  Stand with your feet shoulder-width apart.  Get as close to the object as you can. Do not try to pick up heavy objects that are far from your body.  Use handles or lifting straps if they are available.  Bend at your knees. Squat down, but keep your heels off the floor.  Keep your shoulders pulled back, your chin tucked in, and your back straight.  Lift the object slowly, tightening the muscles in your legs, abdomen, and buttocks. Keep the object as close to the center of your body as possible.  When you put a load down, use these same guidelines in reverse.  Do not:  Lift the object above your waist.  Twist at the waist while lifting or carrying a load. Move your feet if you  need to turn, not your waist.  Bend over without bending at your knees.  Avoid reaching over your head, across a table, or for an object on a high surface. OTHER TIPS  Avoid wet floors and keep sidewalks clear of ice to prevent falls.  Do not sleep on a mattress that is too soft or too hard.  Keep items that are used frequently within easy reach.  Put heavier objects on shelves at waist level and lighter objects on lower or higher shelves.  Find ways to decrease your stress, such as exercise, massage, or relaxation techniques. Stress can build up in your muscles. Tense muscles are more vulnerable to injury.  Seek treatment for depression or anxiety if needed. These conditions can increase your risk of developing back pain. SEEK MEDICAL CARE IF:  You injure your back.  You have questions about diet, exercise, or other ways to prevent back injuries. MAKE SURE YOU:  Understand these instructions.  Will watch your condition.  Will get help right away  if you are not doing well or get worse. Document Released: 06/13/2004 Document Revised: 07/29/2011 Document Reviewed: 06/17/2011 [facility] Patient Information 2015 [place], [place]. This information is not intended to replace advice given to you by your health care provider. Make sure you discuss any questions you have with your health care provider.

## 2014-06-13 NOTE — ED Provider Notes (Signed)
CSN: [id number]     Arrival date & time 06/13/14  1812 History   First MD Initiated Contact with Patient 06/13/14 1818     Chief Complaint  Patient presents with  . Fall     (Consider location/radiation/quality/duration/timing/severity/associated sxs/prior Treatment) HPI  Kendra Graham is a 29 y.o. female with PMH of asthma presenting with fall on the ice 3 hours ago. Patient was carrying a box and a dog ran underneath her feet and she slipped landing on her back and right shoulder. Patient with sharp pain is gradually getting worse. She has not taken anything for it she denies numbness, tingling, weakness. No redness or swelling. No fevers or chills. No fevers, chills, night sweats, weight loss, IVDU, history of malignancy. No loss of control of bladder or bowel. No numbness/tingling, weakness or saddle anesthesia. No abdominal or urinary complaints.    Past Medical History  Diagnosis Date  . Asthma   . Pancreatitis   . Arthritic-like pain    History reviewed. No pertinent past surgical history. Family History  Problem Relation Age of Onset  . Diabetes Mother   . Hyperlipidemia Mother   . Hypertension Mother   . Cancer Father   . Cancer Maternal Grandmother    History  Substance Use Topics  . Smoking status: Never Smoker   . Smokeless tobacco: Not on file  . Alcohol Use: No   OB History    Gravida Para Term Preterm AB TAB SAB Ectopic Multiple Living   1              Review of Systems  Constitutional: Negative for fever and chills.  Gastrointestinal: Negative for nausea, vomiting and diarrhea.  Genitourinary: Negative for dysuria and hematuria.  Musculoskeletal: Positive for back pain. Negative for gait problem.  Neurological: Negative for weakness and headaches.      Allergies  Vinyl ether  Home Medications   Prior to Admission medications   Not on File   BP 117/82 mmHg  Pulse 83  Temp(Src) 98.5 F (36.9 C) (Oral)  Resp 16  Ht 5\' 4"  (1.626 m)  Wt 103  lb (46.72 kg)  BMI 17.67 kg/m2  SpO2 100%  LMP 06/13/2014 Physical Exam  Constitutional: She appears well-developed and well-nourished. No distress.  HENT:  Head: Normocephalic and atraumatic.  Eyes: Conjunctivae are normal. Right eye exhibits no discharge. Left eye exhibits no discharge.  Cardiovascular: Normal rate, regular rhythm and normal heart sounds.   Pulmonary/Chest: Effort normal and breath sounds normal. No respiratory distress. She has no wheezes.  Abdominal: Soft. Bowel sounds are normal. She exhibits no distension. There is no tenderness.  Musculoskeletal:  No midline back tenderness, step off or crepitus. Right and Left sided lower back tenderness. No CVA tenderness. No right-sided clavicular step-off or tenderness. No deformity of right shoulder noted. No redness, swelling, warmth. Full range of motion with pain with abduction.   Neurological: She is alert. Coordination normal.  Equal muscle tone. 5/5 strength in lower and upper extremities. DTR equal and intact. Negative straight leg test. Normal gait.   Skin: Skin is warm and dry. She is not diaphoretic.  Nursing note and vitals reviewed.   ED Course  Procedures (including critical care time) Labs Review Labs Reviewed - No data to display  Imaging Review Dg Lumbar Spine Complete  06/13/2014   CLINICAL DATA:  Fall.  Back pain.  Injury today.  EXAM: LUMBAR SPINE - COMPLETE 4+ VIEW  COMPARISON:  None.  FINDINGS: Vertebral body height  is within normal limits. Intervertebral disc spaces are also normal. There is no fracture. There is a levoconvex lower lumbar curve with the apex at L4-L5. This may be positional or secondary to spasm. The SI joints and sacral arcades appear within normal limits. No pars defects are present. Lumbosacral junction appears normal.  IMPRESSION: No acute osseous abnormality.   Electronically Signed   By: Dereck Ligas M.D.   On: 06/13/2014 19:33   Dg Shoulder Right  06/13/2014   CLINICAL  DATA:  Status post fall, with right posterior shoulder pain. Initial encounter.  EXAM: RIGHT SHOULDER - 2+ VIEW  COMPARISON:  None.  FINDINGS: There is no evidence of fracture or dislocation. The right humeral head is seated within the glenoid fossa. The acromioclavicular joint is unremarkable in appearance. No significant soft tissue abnormalities are seen. The visualized portions of the right lung are clear.  IMPRESSION: No evidence of fracture or dislocation.   Electronically Signed   By: Garald Balding M.D.   On: 06/13/2014 19:32     EKG Interpretation None      MDM   Final diagnoses:  Fall  Right shoulder pain  Bilateral low back pain without sciatica   Patient with back pain and shoulder pain after fall on ice. No loss of bowel or bladder control. No saddle anesthesia. No fever, night sweats, weight loss, h/o cancer, IVDU. VSS. No neurological deficits and normal neuro exam. No concern for cauda equina. Patient also with right shoulder pain. Neurovascularly intact without erythema, swelling, redness. I doubt septic arthritis. X-ray without obvious fracture or dislocation. RICE protocol discussed with patient. Patient is afebrile, nontoxic, and in no acute distress. Patient is appropriate for outpatient management and is stable for discharge.  Discussed return precautions with patient. Discussed all results and patient verbalizes understanding and agrees with plan.    Pura Spice, PA-C 06/13/14 2026  Wandra Arthurs, MD 06/13/14 [phone]

## 2014-06-13 NOTE — ED Notes (Signed)
Pt c/o fall on ice c/o lower back pain

## 2014-08-03 ENCOUNTER — Emergency Department (HOSPITAL_BASED_OUTPATIENT_CLINIC_OR_DEPARTMENT_OTHER)
Admission: EM | Admit: 2014-08-03 | Discharge: 2014-08-03 | Disposition: A | Discharge status: Home / Self Care | Payer: Medicaid Other | Attending: Emergency Medicine | Admitting: Emergency Medicine

## 2014-08-03 ENCOUNTER — Encounter (HOSPITAL_BASED_OUTPATIENT_CLINIC_OR_DEPARTMENT_OTHER): Payer: Self-pay | Admitting: *Deleted

## 2014-08-03 DIAGNOSIS — R1110 Vomiting, unspecified: Secondary | ICD-10-CM

## 2014-08-03 DIAGNOSIS — J45909 Unspecified asthma, uncomplicated: Secondary | ICD-10-CM | POA: Insufficient documentation

## 2014-08-03 DIAGNOSIS — Z8739 Personal history of other diseases of the musculoskeletal system and connective tissue: Secondary | ICD-10-CM | POA: Diagnosis not present

## 2014-08-03 DIAGNOSIS — Z3202 Encounter for pregnancy test, result negative: Secondary | ICD-10-CM | POA: Insufficient documentation

## 2014-08-03 DIAGNOSIS — R112 Nausea with vomiting, unspecified: Secondary | ICD-10-CM | POA: Diagnosis present

## 2014-08-03 DIAGNOSIS — R197 Diarrhea, unspecified: Secondary | ICD-10-CM

## 2014-08-03 DIAGNOSIS — Z8719 Personal history of other diseases of the digestive system: Secondary | ICD-10-CM | POA: Insufficient documentation

## 2014-08-03 DIAGNOSIS — B349 Viral infection, unspecified: Secondary | ICD-10-CM | POA: Diagnosis not present

## 2014-08-03 LAB — URINALYSIS, ROUTINE W REFLEX MICROSCOPIC
BILIRUBIN URINE: NEGATIVE
GLUCOSE, UA: NEGATIVE mg/dL
HGB URINE DIPSTICK: NEGATIVE
Ketones, ur: NEGATIVE mg/dL
Nitrite: NEGATIVE
PH: 5 (ref 5.0–8.0)
PROTEIN: NEGATIVE mg/dL
Specific Gravity, Urine: 1.019 (ref 1.005–1.030)
UROBILINOGEN UA: 0.2 mg/dL (ref 0.0–1.0)

## 2014-08-03 LAB — URINE MICROSCOPIC-ADD ON

## 2014-08-03 LAB — PREGNANCY, URINE: PREG TEST UR: NEGATIVE

## 2014-08-03 MED ORDER — ONDANSETRON HCL 4 MG PO TABS: 4.0000 mg | ORAL_TABLET | Freq: Four times a day (QID) | ORAL | Status: DC

## 2014-08-03 NOTE — ED Notes (Signed)
PA at bedside.

## 2014-08-03 NOTE — ED Provider Notes (Signed)
CSN: [id number]     Arrival date & time 08/03/14  1253 History   First MD Initiated Contact with Patient 08/03/14 1300     Chief Complaint  Patient presents with  . Emesis     (Consider location/radiation/quality/duration/timing/severity/associated sxs/prior Treatment) HPI Comments: 29 year old female presenting with nausea, vomiting and diarrhea beginning at 6:45 AM today. 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 This morning, suddenly started to feel nauseous, had 4 episodes of nonbloody, nonbilious emesis and greater than 5 episodes of nonbloody diarrhea. At work, she was given a Phenergan which completely resolved her nausea and vomiting. Last episode of diarrhea was on arrival to the emergency department. Denies fever, chills, abdominal pain, increased urinary frequency, urgency, dysuria or GYN symptoms. No exposure to C-diff. No recent travel or antibiotic use.  Patient is a 29 y.o. female presenting with vomiting. The history is provided by the patient.  Emesis Associated symptoms: diarrhea     Past Medical History  Diagnosis Date  . Asthma   . Pancreatitis   . Arthritic-like pain    History reviewed. No pertinent past surgical history. Family History  Problem Relation Age of Onset  . Diabetes Mother   . Hyperlipidemia Mother   . Hypertension Mother   . Cancer Father   . Cancer Maternal Grandmother    History  Substance Use Topics  . Smoking status: Never Smoker   . Smokeless tobacco: Not on file  . Alcohol Use: No   OB History    Gravida Para Term Preterm AB TAB SAB Ectopic Multiple Living   1              Review of Systems  Gastrointestinal: Positive for nausea, vomiting and diarrhea.  All other systems reviewed and are negative.     Allergies  Vinyl ether  Home Medications   Prior to Admission medications   Medication Sig Start Date End Date Taking? Authorizing Provider   ondansetron (ZOFRAN) 4 MG tablet Take 1 tablet (4 mg total) by mouth every 6 (six) hours. 08/03/14   Kerline Trahan M Kailee Essman, PA-C   BP 115/74 mmHg  Pulse 68  Temp(Src) 98.7 F (37.1 C) (Oral)  Resp 16  Ht 5' 4.5" (1.638 m)  Wt 100 lb (45.36 kg)  BMI 16.91 kg/m2  SpO2 100%  LMP 07/28/2014 Physical Exam  Constitutional: She is oriented to person, place, and time. She appears well-developed and well-nourished. No distress.  HENT:  Head: Normocephalic and atraumatic.  Mouth/Throat: Oropharynx is clear and moist.  Moist MM.  Eyes: Conjunctivae and EOM are normal. Pupils are equal, round, and reactive to light.  Neck: Normal range of motion. Neck supple.  Cardiovascular: Normal rate, regular rhythm and normal heart sounds.   Pulmonary/Chest: Effort normal and breath sounds normal.  Abdominal: Soft. Bowel sounds are normal. She exhibits no distension and no mass. There is no tenderness. There is no rebound and no guarding.  Musculoskeletal: Normal range of motion. She exhibits no edema.  Neurological: She is alert and oriented to person, place, and time.  Skin: Skin is warm and dry. She is not diaphoretic.  Psychiatric: She has a normal mood and affect. Her behavior is normal.  Nursing note and vitals reviewed.   ED Course  Procedures (including critical care time) Labs Review Labs Reviewed  URINALYSIS, ROUTINE W REFLEX MICROSCOPIC - Abnormal; Notable for the following:    Leukocytes, UA SMALL (*)  All other components within normal limits  URINE MICROSCOPIC-ADD ON - Abnormal; Notable for the following:    Bacteria, UA FEW (*)    All other components within normal limits  PREGNANCY, URINE    Imaging Review No results found.   EKG Interpretation None      MDM   Final diagnoses:  Viral illness  Vomiting and diarrhea   Nontoxic appearing, NAD. AF VSS. Abdomen is soft and nontender. Moist mucous membranes. Discussed symptomatic treatment, including low prescribe Zofran for  nausea. She may continue to take Imodium. BRAT diet. Unlikely c-diff, no exposure, no recent antibiotic use or travel. Symptoms present for less than 12 hours. No further n/v PTA. Stable for d/c. Return precautions given. Patient states understanding of treatment care plan and is agreeable.  Carman Ching, PA-C 08/03/14 [place], DO 08/03/14 [phone]

## 2014-08-03 NOTE — ED Notes (Signed)
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 Phenergan has helped the emesis but the imodium is not helping.

## 2014-08-03 NOTE — Discharge Instructions (Signed)
Take Zofran as directed as needed for nausea. Continue to take Imodium as needed. Rest and stay well-hydrated.  Diarrhea Diarrhea is frequent loose and watery bowel movements. It can cause you to feel weak and dehydrated. Dehydration can cause you to become tired and thirsty, have a dry mouth, and have decreased urination that often is dark yellow. Diarrhea is a sign of another problem, most often an infection that will not last long. In most cases, diarrhea typically lasts 2-3 days. However, it can last longer if it is a sign of something more serious. It is important to treat your diarrhea as directed by your caregiver to lessen or prevent future episodes of diarrhea. CAUSES  Some common causes include:  Gastrointestinal infections caused by viruses, bacteria, or parasites.  Food poisoning or food allergies.  Certain medicines, such as antibiotics, chemotherapy, and laxatives.  Artificial sweeteners and fructose.  Digestive disorders. HOME CARE INSTRUCTIONS  Ensure adequate fluid intake (hydration): Have 1 cup (8 oz) of fluid for each diarrhea episode. Avoid fluids that contain simple sugars or sports drinks, fruit juices, whole milk products, and sodas. Your urine should be clear or pale yellow if you are drinking enough fluids. Hydrate with an oral rehydration solution that you can purchase at pharmacies, retail stores, and online. You can prepare an oral rehydration solution at home by mixing the following ingredients together:   - tsp table salt.   tsp baking soda.   tsp salt substitute containing potassium chloride.  1  tablespoons sugar.  1 L (34 oz) of water.  Certain foods and beverages may increase the speed at which food moves through the gastrointestinal (GI) tract. These foods and beverages should be avoided and include:  Caffeinated and alcoholic beverages.  High-fiber foods, such as raw fruits and vegetables, nuts, seeds, and whole grain breads and cereals.  Foods  and beverages sweetened with sugar alcohols, such as xylitol, sorbitol, and mannitol.  Some foods may be well tolerated and may help thicken stool including:  Starchy foods, such as rice, toast, pasta, low-sugar cereal, oatmeal, grits, baked potatoes, crackers, and bagels.  Bananas.  Applesauce.  Add probiotic-rich foods to help increase healthy bacteria in the GI tract, such as yogurt and fermented milk products.  Wash your hands well after each diarrhea episode.  Only take over-the-counter or prescription medicines as directed by your caregiver.  Take a warm bath to relieve any burning or pain from frequent diarrhea episodes. SEEK IMMEDIATE MEDICAL CARE IF:   You are unable to keep fluids down.  You have persistent vomiting.  You have blood in your stool, or your stools are black and tarry.  You do not urinate in 6-8 hours, or there is only a small amount of very dark urine.  You have abdominal pain that increases or localizes.  You have weakness, dizziness, confusion, or light-headedness.  You have a severe headache.  Your diarrhea gets worse or does not get better.  You have a fever or persistent symptoms for more than 2-3 days.  You have a fever and your symptoms suddenly get worse. MAKE SURE YOU:   Understand these instructions.  Will watch your condition.  Will get help right away if you are not doing well or get worse. Document Released: 04/26/2002 Document Revised: 09/20/2013 Document Reviewed: 01/12/2012 [facility] Patient Information 2015 [place], [place]. This information is not intended to replace advice given to you by your health care provider. Make sure you discuss any questions you have with your health  care provider.  Nausea and Vomiting Nausea is a sick feeling that often comes before throwing up (vomiting). Vomiting is a reflex where stomach contents come out of your mouth. Vomiting can cause severe loss of body fluids (dehydration). Children and  elderly adults can become dehydrated quickly, especially if they also have diarrhea. Nausea and vomiting are symptoms of a condition or disease. It is important to find the cause of your symptoms. CAUSES   Direct irritation of the stomach lining. This irritation can result from increased acid production (gastroesophageal reflux disease), infection, food poisoning, taking certain medicines (such as nonsteroidal anti-inflammatory drugs), alcohol use, or tobacco use.  Signals from the brain.These signals could be caused by a headache, heat exposure, an inner ear disturbance, increased pressure in the brain from injury, infection, a tumor, or a concussion, pain, emotional stimulus, or metabolic problems.  An obstruction in the gastrointestinal tract (bowel obstruction).  Illnesses such as diabetes, hepatitis, gallbladder problems, appendicitis, kidney problems, cancer, sepsis, atypical symptoms of a heart attack, or eating disorders.  Medical treatments such as chemotherapy and radiation.  Receiving medicine that makes you sleep (general anesthetic) during surgery. DIAGNOSIS Your caregiver may ask for tests to be done if the problems do not improve after a few days. Tests may also be done if symptoms are severe or if the reason for the nausea and vomiting is not clear. Tests may include:  Urine tests.  Blood tests.  Stool tests.  Cultures (to look for evidence of infection).  X-rays or other imaging studies. Test results can help your caregiver make decisions about treatment or the need for additional tests. TREATMENT You need to stay well hydrated. Drink frequently but in small amounts.You may wish to drink water, sports drinks, clear broth, or eat frozen ice pops or gelatin dessert to help stay hydrated.When you eat, eating slowly may help prevent nausea.There are also some antinausea medicines that may help prevent nausea. HOME CARE INSTRUCTIONS   Take all medicine as directed by  your caregiver.  If you do not have an appetite, do not force yourself to eat. However, you must continue to drink fluids.  If you have an appetite, eat a normal diet unless your caregiver tells you differently.  Eat a variety of complex carbohydrates (rice, wheat, potatoes, bread), lean meats, yogurt, fruits, and vegetables.  Avoid high-fat foods because they are more difficult to digest.  Drink enough water and fluids to keep your urine clear or pale yellow.  If you are dehydrated, ask your caregiver for specific rehydration instructions. Signs of dehydration may include:  Severe thirst.  Dry lips and mouth.  Dizziness.  Dark urine.  Decreasing urine frequency and amount.  Confusion.  Rapid breathing or pulse. SEEK IMMEDIATE MEDICAL CARE IF:   You have blood or brown flecks (like coffee grounds) in your vomit.  You have black or bloody stools.  You have a severe headache or stiff neck.  You are confused.  You have severe abdominal pain.  You have chest pain or trouble breathing.  You do not urinate at least once every 8 hours.  You develop cold or clammy skin.  You continue to vomit for longer than 24 to 48 hours.  You have a fever. MAKE SURE YOU:   Understand these instructions.  Will watch your condition.  Will get help right away if you are not doing well or get worse. Document Released: 05/06/2005 Document Revised: 07/29/2011 Document Reviewed: 10/03/2010 [facility] Patient Information 2015 [place], [place]. This  information is not intended to replace advice given to you by your health care provider. Make sure you discuss any questions you have with your health care provider. Food Choices to Help Relieve Diarrhea When you have diarrhea, the foods you eat and your eating habits are very important. Choosing the right foods and drinks can help relieve diarrhea. Also, because diarrhea can last up to 7 days, you need to replace lost fluids and electrolytes  (such as sodium, potassium, and chloride) in order to help prevent dehydration.  WHAT GENERAL GUIDELINES DO I NEED TO FOLLOW?  Slowly drink 1 cup (8 oz) of fluid for each episode of diarrhea. If you are getting enough fluid, your urine will be clear or pale yellow.  Eat starchy foods. Some good choices include white rice, white toast, pasta, low-fiber cereal, baked potatoes (without the skin), saltine crackers, and bagels.  Avoid large servings of any cooked vegetables.  Limit fruit to two servings per day. A serving is  cup or 1 small piece.  Choose foods with less than 2 g of fiber per serving.  Limit fats to less than 8 tsp (38 g) per day.  Avoid fried foods.  Eat foods that have probiotics in them. Probiotics can be found in certain dairy products.  Avoid foods and beverages that may increase the speed at which food moves through the stomach and intestines (gastrointestinal tract). Things to avoid include:  High-fiber foods, such as dried fruit, raw fruits and vegetables, nuts, seeds, and whole grain foods.  Spicy foods and high-fat foods.  Foods and beverages sweetened with high-fructose corn syrup, honey, or sugar alcohols such as xylitol, sorbitol, and mannitol. WHAT FOODS ARE RECOMMENDED? Grains White rice. White, [place], or pita breads (fresh or toasted), including plain rolls, buns, or bagels. White pasta. Saltine, soda, or graham crackers. Pretzels. Low-fiber cereal. Cooked cereals made with water (such as cornmeal, farina, or cream cereals). Plain muffins. Matzo. Melba toast. Zwieback.  Vegetables Potatoes (without the skin). Strained tomato and vegetable juices. Most well-cooked and canned vegetables without seeds. Tender lettuce. Fruits Cooked or canned applesauce, apricots, cherries, fruit cocktail, grapefruit, peaches, pears, or plums. Fresh bananas, apples without skin, cherries, grapes, cantaloupe, grapefruit, peaches, oranges, or plums.  Meat and Other Protein  Products Baked or boiled chicken. Eggs. Tofu. Fish. Seafood. Smooth peanut butter. Ground or well-cooked tender beef, ham, veal, lamb, pork, or poultry.  Dairy Plain yogurt, kefir, and unsweetened liquid yogurt. Lactose-free milk, buttermilk, or soy milk. Plain hard cheese. Beverages Sport drinks. Clear broths. Diluted fruit juices (except prune). Regular, caffeine-free sodas such as ginger ale. Water. Decaffeinated teas. Oral rehydration solutions. Sugar-free beverages not sweetened with sugar alcohols. Other Bouillon, broth, or soups made from recommended foods.  The items listed above may not be a complete list of recommended foods or beverages. Contact your dietitian for more options. WHAT FOODS ARE NOT RECOMMENDED? Grains Whole grain, whole wheat, bran, or rye breads, rolls, pastas, crackers, and cereals. Wild or brown rice. Cereals that contain more than 2 g of fiber per serving. Corn tortillas or taco shells. Cooked or dry oatmeal. Granola. Popcorn. Vegetables Raw vegetables. Cabbage, broccoli, Brussels sprouts, artichokes, baked beans, beet greens, corn, kale, legumes, peas, sweet potatoes, and yams. Potato skins. Cooked spinach and cabbage. Fruits Dried fruit, including raisins and dates. Raw fruits. Stewed or dried prunes. Fresh apples with skin, apricots, mangoes, pears, raspberries, and strawberries.  Meat and Other Protein Products Chunky peanut butter. Nuts and seeds. Beans and lentils. Berniece Salines.  Dairy High-fat cheeses. Milk, chocolate milk, and beverages made with milk, such as milk shakes. Cream. Ice cream. Sweets and Desserts Sweet rolls, doughnuts, and sweet breads. Pancakes and waffles. Fats and Oils Butter. Cream sauces. Margarine. Salad oils. Plain salad dressings. Olives. Avocados.  Beverages Caffeinated beverages (such as coffee, tea, soda, or energy drinks). Alcoholic beverages. Fruit juices with pulp. Prune juice. Soft drinks sweetened with high-fructose corn syrup or  sugar alcohols. Other Coconut. Hot sauce. Chili powder. Mayonnaise. Gravy. Cream-based or milk-based soups.  The items listed above may not be a complete list of foods and beverages to avoid. Contact your dietitian for more information. WHAT SHOULD I DO IF I BECOME DEHYDRATED? Diarrhea can sometimes lead to dehydration. Signs of dehydration include dark urine and dry mouth and skin. If you think you are dehydrated, you should rehydrate with an oral rehydration solution. These solutions can be purchased at pharmacies, retail stores, or online.  Drink -1 cup (120-240 mL) of oral rehydration solution each time you have an episode of diarrhea. If drinking this amount makes your diarrhea worse, try drinking smaller amounts more often. For example, drink 1-3 tsp (5-15 mL) every 5-10 minutes.  A general rule for staying hydrated is to drink 1-2 L of fluid per day. Talk to your health care provider about the specific amount you should be drinking each day. Drink enough fluids to keep your urine clear or pale yellow. Document Released: 07/27/2003 Document Revised: 05/11/2013 Document Reviewed: 03/29/2013 [facility] Patient Information 2015 [place], [place]. This information is not intended to replace advice given to you by your health care provider. Make sure you discuss any questions you have with your health care provider.

## 2014-08-03 NOTE — ED Notes (Signed)
Pt reports phenergan is helping but she's still having diarrhea.

## 2018-05-27 ENCOUNTER — Encounter (HOSPITAL_BASED_OUTPATIENT_CLINIC_OR_DEPARTMENT_OTHER): Payer: Self-pay | Admitting: *Deleted

## 2018-05-27 ENCOUNTER — Emergency Department (HOSPITAL_BASED_OUTPATIENT_CLINIC_OR_DEPARTMENT_OTHER)
Admission: EM | Admit: 2018-05-27 | Discharge: 2018-05-27 | Disposition: A | Discharge status: Home / Self Care | Attending: Emergency Medicine | Admitting: Emergency Medicine

## 2018-05-27 ENCOUNTER — Other Ambulatory Visit: Payer: Self-pay

## 2018-05-27 DIAGNOSIS — Z20828 Contact with and (suspected) exposure to other viral communicable diseases: Secondary | ICD-10-CM | POA: Diagnosis not present

## 2018-05-27 DIAGNOSIS — J111 Influenza due to unidentified influenza virus with other respiratory manifestations: Secondary | ICD-10-CM | POA: Insufficient documentation

## 2018-05-27 DIAGNOSIS — J45909 Unspecified asthma, uncomplicated: Secondary | ICD-10-CM | POA: Diagnosis not present

## 2018-05-27 DIAGNOSIS — R69 Illness, unspecified: Secondary | ICD-10-CM

## 2018-05-27 DIAGNOSIS — R0981 Nasal congestion: Secondary | ICD-10-CM | POA: Diagnosis present

## 2018-05-27 LAB — GROUP A STREP BY PCR: Group A Strep by PCR: NOT DETECTED

## 2018-05-27 MED ORDER — BENZONATATE 100 MG PO CAPS: 100.0000 mg | ORAL_CAPSULE | Freq: Three times a day (TID) | ORAL | 0 refills | Status: DC

## 2018-05-27 MED ORDER — IBUPROFEN 800 MG PO TABS: 800.0000 mg | ORAL_TABLET | Freq: Three times a day (TID) | ORAL | 0 refills | Status: DC | PRN

## 2018-05-27 MED ORDER — FLUTICASONE PROPIONATE 50 MCG/ACT NA SUSP: 1.0000 | Freq: Every day | NASAL | 0 refills | Status: DC

## 2018-05-27 MED ORDER — OSELTAMIVIR PHOSPHATE 75 MG PO CAPS: 75.0000 mg | ORAL_CAPSULE | Freq: Two times a day (BID) | ORAL | 0 refills | Status: DC

## 2018-05-27 MED ORDER — ACETAMINOPHEN 325 MG PO TABS
650.0000 mg | ORAL_TABLET | Freq: Once | ORAL | Status: AC
  Administered 2018-05-27: 650 mg via ORAL
  Filled 2018-05-27: qty 2

## 2018-05-27 NOTE — ED Triage Notes (Signed)
Pt c/o Uri sytmpoms x 2 days

## 2018-05-27 NOTE — Discharge Instructions (Signed)
You were seen in the emergency today for upper respiratory symptoms, we suspect your symptoms are related to a virus, possibly the flu. Your strep test was negative. We have prescribed you multiple medications to treat your symptoms.   -Flonase to be used 1 spray in each nostril daily.  This medication is used to treat your congestion.  -Tessalon can be taken once every 8 hours as needed.  This medication is used to treat your cough.  -Ibuprofen to be taken once every 8 hours as needed for pain. Please take this medicine with food as it can cause stomach upset and at worst stomach bleeding. Do not take other NSAIDs such as motrin, aleve, advil, naproxen, mobic, etc as they are similar. You make take tylenol per over the counter dosing with this medicine safely.  - Tamiflu- you likely have the flu given your son tested positive.  Tamiflu is a medicine that can help shorten the duration of your symptoms.  He does have potential side effects as discussed.  You may take this if you would like, if you want to start taking it be sure to start in the next 24 hours.  We have prescribed you new medication(s) today. Discuss the medications prescribed today with your pharmacist as they can have adverse effects and interactions with your other medicines including over the counter and prescribed medications. Seek medical evaluation if you start to experience new or abnormal symptoms after taking one of these medicines, seek care immediately if you start to experience difficulty breathing, feeling of your throat closing, facial swelling, or rash as these could be indications of a more serious allergic reaction  You will need to follow-up with your primary care provider in 1 week if your symptoms have not improved.  If you do not have a primary care provider one is provided in your discharge instructions.  Return to the emergency department for any new or worsening symptoms including but not limited to persistent fever  for 5 days, difficulty breathing, chest pain, rashes, passing out, or any other concerns.

## 2018-05-27 NOTE — ED Notes (Addendum)
Pt appears to be feeling better as she is playing games on her phone and in no acute distress, able to hold down oral fluids

## 2018-05-27 NOTE — ED Notes (Signed)
Pt playing on phone during dc instructions, denies any needs or questions

## 2018-05-27 NOTE — ED Provider Notes (Addendum)
[facility] EMERGENCY DEPARTMENT Provider Note   CSN: [id number] Arrival date & time: 05/27/18  1445     History   Chief Complaint Chief Complaint  Patient presents with  . Influenza    HPI Kendra Graham is a 33 y.o. female with a hx of asthma and prior pancreatitis who presents to the ED with complaints of flulike symptoms that started last night around 5:00 PM.  Patient states she is experiencing congestion, rhinorrhea, bilateral ear pain, sore throat, dry cough, body aches, subjective fever, and chills.  Symptoms are constant.  No specific alleviating or aggravating factors.  No intervention prior to arrival.  Patient son recently tested positive for flu B and was treated with Tamiflu.  Patient denies vomiting, diarrhea, abdominal pain, chest pain, or dyspnea.  She denies chance of pregnancy.  HPI  Past Medical History:  Diagnosis Date  . Arthritic-like pain   . Asthma   . Pancreatitis     There are no active problems to display for this patient.   History reviewed. No pertinent surgical history.   OB History    Gravida  1   Para      Term      Preterm      AB      Living        SAB      TAB      Ectopic      Multiple      Live Births               Home Medications    Prior to Admission medications   Medication Sig Start Date End Date Taking? Authorizing Provider  ondansetron (ZOFRAN) 4 MG tablet Take 1 tablet (4 mg total) by mouth every 6 (six) hours. 08/03/14   Hess, Hessie Diener, PA-C    Family History Family History  Problem Relation Age of Onset  . Diabetes Mother   . Hyperlipidemia Mother   . Hypertension Mother   . Cancer Father   . Cancer Maternal Grandmother     Social History Social History   Tobacco Use  . Smoking status: Never Smoker  . Smokeless tobacco: Never Used  Substance Use Topics  . Alcohol use: No  . Drug use: No     Allergies   Vinyl ether   Review of Systems Review of Systems  Constitutional:  Positive for chills and fever.  HENT: Positive for congestion, ear pain, rhinorrhea and sore throat.   Respiratory: Positive for cough. Negative for shortness of breath.   Cardiovascular: Negative for chest pain.  Gastrointestinal: Negative for abdominal pain, diarrhea and vomiting.  Musculoskeletal: Positive for myalgias.   Physical Exam Updated Vital Signs BP 126/90   Pulse (!) 117   Temp 100.3 F (37.9 C) (Oral)   Resp 18   Ht 5' 3.5" (1.613 m)   Wt 66.2 kg   LMP 05/24/2018   SpO2 100%   BMI 25.46 kg/m   Physical Exam Vitals signs and nursing note reviewed.  Constitutional:      General: She is not in acute distress.    Appearance: She is well-developed.  HENT:     Head: Normocephalic and atraumatic.     Right Ear: Tympanic membrane is not perforated, erythematous, retracted or bulging.     Left Ear: Tympanic membrane is not perforated, erythematous, retracted or bulging.     Nose: Mucosal edema present.     Mouth/Throat:     Mouth: Mucous membranes are  moist.     Pharynx: Uvula midline. Posterior oropharyngeal erythema present. No oropharyngeal exudate.     Comments: Posterior oropharynx is symmetric appearing. Patient tolerating own secretions without difficulty. No trismus. No drooling. No hot potato voice. No swelling beneath the tongue, submandibular compartment is soft.  Eyes:     General:        Right eye: No discharge.        Left eye: No discharge.     Conjunctiva/sclera: Conjunctivae normal.     Pupils: Pupils are equal, round, and reactive to light.  Neck:     Musculoskeletal: Normal range of motion and neck supple. No edema, erythema or neck rigidity.  Cardiovascular:     Rate and Rhythm: Regular rhythm. Tachycardia present.     Heart sounds: No murmur.  Pulmonary:     Effort: Pulmonary effort is normal. No respiratory distress.     Breath sounds: Normal breath sounds. No wheezing or rales.     Comments: Respirations even and unlabored. Abdominal:      General: There is no distension.     Palpations: Abdomen is soft.     Tenderness: There is no abdominal tenderness.  Lymphadenopathy:     Cervical: Cervical adenopathy (mild) present.  Skin:    General: Skin is warm and dry.     Findings: No rash.  Neurological:     Mental Status: She is alert.  Psychiatric:        Behavior: Behavior normal.    ED Treatments / Results  Labs (all labs ordered are listed, but only abnormal results are displayed) Labs Reviewed  GROUP A STREP BY PCR    EKG None  Radiology No results found.  Procedures Procedures (including critical care time)  Medications Ordered in ED Medications  acetaminophen (TYLENOL) tablet 650 mg (650 mg Oral Given 05/27/18 1505)     Initial Impression / Assessment and Plan / ED Course  I have reviewed the triage vital signs and the nursing notes.  Pertinent labs & imaging results that were available during my care of the patient were reviewed by me and considered in my medical decision making (see chart for details).    Patient presents with flu like sxs symptoms.  Patient is nontoxic appearing, in no apparent distress, vitals w/ tachycardia and borderline temp at 100.3 orally- trending down with anti-pyretics in the ER. Patient lungs are CTA, doubt pneumonia. There is no wheezing or signs of respiratory distress. Sxs onset < 7 days, no sinus tenderness, doubt acute bacterial sinusitis. Strep negative, no evidence of RPA/PTA. No evidence of AOM on exam. No meningeal signs. Suspect viral, likely influenza given son positive for flu B recently, will treat supportively with Ibuprofen, Flonase, and Tessalon, discussed option of tamiflu- risks/benefits, patient would like prescription for this and it was provided. I discussed results, treatment plan, need for PCP follow-up, and return precautions with the patient. Provided opportunity for questions, patient confirmed understanding and is in agreement with plan.   Vitals:    05/27/18 1451 05/27/18 1613  BP: 126/90   Pulse: (!) 117 (!) 105  Resp: 18   Temp: 100.3 F (37.9 C) 100.2 F (37.9 C)  SpO2: 100%     Final Clinical Impressions(s) / ED Diagnoses   Final diagnoses:  Influenza-like illness    ED Discharge Orders         Ordered    oseltamivir (TAMIFLU) 75 MG capsule  Every 12 hours     05/27/18 1616  fluticasone (FLONASE) 50 MCG/ACT nasal spray  Daily     05/27/18 1616    benzonatate (TESSALON) 100 MG capsule  Every 8 hours     05/27/18 1617    ibuprofen (ADVIL,MOTRIN) 800 MG tablet  Every 8 hours PRN     05/27/18 1617           Elliette Seabolt, [place], PA-C 05/27/18 1619    Amaryllis Dyke, PA-C 05/27/18 1620    Quintella Reichert, MD 05/27/18 1744

## 2018-05-29 ENCOUNTER — Other Ambulatory Visit: Payer: Self-pay

## 2018-05-29 ENCOUNTER — Encounter (HOSPITAL_BASED_OUTPATIENT_CLINIC_OR_DEPARTMENT_OTHER): Payer: Self-pay | Admitting: *Deleted

## 2018-05-29 ENCOUNTER — Emergency Department (HOSPITAL_BASED_OUTPATIENT_CLINIC_OR_DEPARTMENT_OTHER)
Admission: EM | Admit: 2018-05-29 | Discharge: 2018-05-30 | Disposition: A | Discharge status: Home / Self Care | Attending: Emergency Medicine | Admitting: Emergency Medicine

## 2018-05-29 DIAGNOSIS — B349 Viral infection, unspecified: Secondary | ICD-10-CM

## 2018-05-29 DIAGNOSIS — J45909 Unspecified asthma, uncomplicated: Secondary | ICD-10-CM | POA: Diagnosis not present

## 2018-05-29 DIAGNOSIS — R05 Cough: Secondary | ICD-10-CM | POA: Diagnosis present

## 2018-05-29 MED ORDER — ONDANSETRON 8 MG PO TBDP: 8.0000 mg | ORAL_TABLET | Freq: Three times a day (TID) | ORAL | 0 refills | Status: DC | PRN

## 2018-05-29 MED ORDER — HYDROCOD POLST-CPM POLST ER 10-8 MG/5ML PO SUER: 5.0000 mL | Freq: Every evening | ORAL | 0 refills | Status: DC | PRN

## 2018-05-29 MED ORDER — ONDANSETRON 4 MG PO TBDP
4.0000 mg | ORAL_TABLET | Freq: Once | ORAL | Status: AC
  Administered 2018-05-29: 4 mg via ORAL
  Filled 2018-05-29: qty 1

## 2018-05-29 MED ORDER — ACETAMINOPHEN 500 MG PO TABS
1000.0000 mg | ORAL_TABLET | Freq: Once | ORAL | Status: AC
  Administered 2018-05-29: 1000 mg via ORAL
  Filled 2018-05-29: qty 2

## 2018-05-29 MED ORDER — PREDNISONE 10 MG (21) PO TBPK: ORAL_TABLET | ORAL | 0 refills | Status: DC

## 2018-05-29 NOTE — ED Provider Notes (Signed)
[facility] DEPARTMENT Provider Note   CSN: [id number] Arrival date & time: 05/29/18  2206     History   Chief Complaint No chief complaint on file.   HPI Kendra Graham is a 33 y.o. female.  HPI   Kendra Graham is a 33 y.o. female, with a history of asthma, presenting to the ED with cough, fever, body aches, and sore throat for 4 days.  Accompanied by nausea. She was evaluated in the ED on January 8, diagnosed with influenza-like illness, and prescribed Tessalon, Flonase, ibuprofen, and Tamiflu.  She states she has been taking these medications without improvement.  She admits to poor fluid intake.  Denies vomiting, diarrhea, abdominal pain, chest pain, shortness of breath, syncope, rash, neck pain/stiffness, or any other complaints.  Past Medical History:  Diagnosis Date  . Arthritic-like pain   . Asthma   . Pancreatitis     There are no active problems to display for this patient.   History reviewed. No pertinent surgical history.   OB History    Gravida  1   Para      Term      Preterm      AB      Living        SAB      TAB      Ectopic      Multiple      Live Births               Home Medications    Prior to Admission medications   Medication Sig Start Date End Date Taking? Authorizing Provider  benzonatate (TESSALON) 100 MG capsule Take 1 capsule (100 mg total) by mouth every 8 (eight) hours. 05/27/18   Petrucelli, Samantha R, PA-C  chlorpheniramine-HYDROcodone (TUSSIONEX PENNKINETIC ER) 10-8 MG/5ML SUER Take 5 mLs by mouth at bedtime as needed for cough. 05/29/18   Joy, Shawn C, PA-C  fluticasone (FLONASE) 50 MCG/ACT nasal spray Place 1 spray into both nostrils daily. 05/27/18   Petrucelli, Samantha R, PA-C  ibuprofen (ADVIL,MOTRIN) 800 MG tablet Take 1 tablet (800 mg total) by mouth every 8 (eight) hours as needed. 05/27/18   Petrucelli, Samantha R, PA-C  ondansetron (ZOFRAN ODT) 8 MG disintegrating tablet Take 1 tablet (8  mg total) by mouth every 8 (eight) hours as needed for nausea or vomiting. 05/29/18   Joy, Shawn C, PA-C  oseltamivir (TAMIFLU) 75 MG capsule Take 1 capsule (75 mg total) by mouth every 12 (twelve) hours. 05/27/18   Petrucelli, Samantha R, PA-C  predniSONE (STERAPRED UNI-PAK 21 TAB) 10 MG (21) TBPK tablet Take 6 tabs (60mg ) day 1, 5 tabs (50mg ) day 2, 4 tabs (40mg ) day 3, 3 tabs (30mg ) day 4, 2 tabs (20mg ) day 5, and 1 tab (10mg ) day 6. 05/29/18   Joy, Helane Gunther, PA-C    Family History Family History  Problem Relation Age of Onset  . Diabetes Mother   . Hyperlipidemia Mother   . Hypertension Mother   . Cancer Father   . Cancer Maternal Grandmother     Social History Social History   Tobacco Use  . Smoking status: Never Smoker  . Smokeless tobacco: Never Used  Substance Use Topics  . Alcohol use: No  . Drug use: No     Allergies   Vinyl ether   Review of Systems Review of Systems  Constitutional: Positive for fever.  HENT: Positive for congestion, ear pain and sore throat. Negative for ear discharge and  voice change.   Respiratory: Positive for cough. Negative for shortness of breath.   Cardiovascular: Negative for chest pain.  Gastrointestinal: Positive for nausea. Negative for abdominal pain, blood in stool, diarrhea and vomiting.  Musculoskeletal: Positive for myalgias. Negative for neck pain and neck stiffness.  All other systems reviewed and are negative.    Physical Exam Updated Vital Signs BP 120/84 (BP Location: Left Arm)   Pulse (!) 140   Temp (!) 102.6 F (39.2 C) (Oral)   Resp (!) 22   Ht 5' 3.5" (1.613 m)   Wt 66.2 kg   LMP 05/24/2018   SpO2 96%   BMI 25.45 kg/m   Physical Exam Vitals signs and nursing note reviewed.  Constitutional:      General: She is not in acute distress.    Appearance: She is well-developed. She is not diaphoretic.  HENT:     Head: Normocephalic and atraumatic.     Right Ear: A middle ear effusion is present.     Left Ear: A  middle ear effusion is present.     Nose: Mucosal edema, congestion and rhinorrhea present. Rhinorrhea is clear.     Mouth/Throat:     Mouth: Mucous membranes are moist.     Pharynx: Oropharynx is clear.  Eyes:     Conjunctiva/sclera: Conjunctivae normal.  Neck:     Musculoskeletal: Neck supple.  Cardiovascular:     Rate and Rhythm: Normal rate and regular rhythm.     Pulses: Normal pulses.     Heart sounds: Normal heart sounds.  Pulmonary:     Effort: Pulmonary effort is normal. No respiratory distress.     Breath sounds: Normal breath sounds.  Abdominal:     Palpations: Abdomen is soft.     Tenderness: There is no abdominal tenderness. There is no guarding.  Musculoskeletal:     Right lower leg: No edema.     Left lower leg: No edema.  Lymphadenopathy:     Cervical: No cervical adenopathy.  Skin:    General: Skin is warm and dry.  Neurological:     Mental Status: She is alert.  Psychiatric:        Mood and Affect: Mood and affect normal.        Speech: Speech normal.        Behavior: Behavior normal.      ED Treatments / Results  Labs (all labs ordered are listed, but only abnormal results are displayed) Labs Reviewed  PREGNANCY, URINE    EKG None  Radiology No results found.  Procedures Procedures (including critical care time)  Medications Ordered in ED Medications  acetaminophen (TYLENOL) tablet 1,000 mg (1,000 mg Oral Given 05/29/18 2349)  ondansetron (ZOFRAN-ODT) disintegrating tablet 4 mg (4 mg Oral Given 05/29/18 2355)     Initial Impression / Assessment and Plan / ED Course  I have reviewed the triage vital signs and the nursing notes.  Pertinent labs & imaging results that were available during my care of the patient were reviewed by me and considered in my medical decision making (see chart for details).     Patient presents with symptoms consistent with viral syndrome, possible influenza.  Poor oral fluid intake that she states is due to  her sore throat, nausea, and overall feeling unwell. Tachycardia and higher fever noted the first time patient was to be discharged. Care initiated with PO medications and fluids.   Dr. Dayna Barker took over care of this patient at the end of  my shift.   Final Clinical Impressions(s) / ED Diagnoses   Final diagnoses:  Viral syndrome    ED Discharge Orders         Ordered    ondansetron (ZOFRAN ODT) 8 MG disintegrating tablet  Every 8 hours PRN     05/29/18 2326    chlorpheniramine-HYDROcodone (TUSSIONEX PENNKINETIC ER) 10-8 MG/5ML SUER  At bedtime PRN     05/29/18 2326    predniSONE (STERAPRED UNI-PAK 21 TAB) 10 MG (21) TBPK tablet     05/29/18 2326           Lorayne Bender, PA-C 05/30/18 0022    Mesner, Corene Cornea, MD 05/30/18 [phone]

## 2018-05-29 NOTE — ED Triage Notes (Signed)
She was seen 2 days ago and told she probable has the flu. Her symptoms are no better.

## 2018-05-29 NOTE — ED Notes (Signed)
Pt noted to have increased temp and HR. EDP notified.

## 2018-05-29 NOTE — Discharge Instructions (Addendum)
Your symptoms are likely consistent with a viral illness. Viruses do not require or respond to antibiotics. Treatment is symptomatic care and it is important to note that these symptoms may last for 7-14 days.   Hand washing: Wash your hands throughout the day, but especially before and after touching the face, using the restroom, sneezing, coughing, or touching surfaces that have been coughed or sneezed upon. Hydration: Symptoms will be intensified and complicated by dehydration. Dehydration can also extend the duration of symptoms. Drink plenty of fluids and get plenty of rest. You should be drinking at least half a liter of water an hour to stay hydrated. Electrolyte drinks (ex. Gatorade, Powerade, Pedialyte) are also encouraged. You should be drinking enough fluids to make your urine light yellow, almost clear. If this is not the case, you are not drinking enough water. Please note that some of the treatments indicated below will not be effective if you are not adequately hydrated. Diet: Please concentrate on hydration, however, you may introduce food slowly.  Start with a clear liquid diet, progressed to a full liquid diet, and then bland solids as you are able. Pain or fever: Ibuprofen, Naproxen, or acetaminophen (generic for Tylenol) for pain or fever.  Antiinflammatory medications: Take 600 mg of ibuprofen every 6 hours or 440 mg (over the counter dose) to 500 mg (prescription dose) of naproxen every 12 hours for the next 3 days. After this time, these medications may be used as needed for pain. Take these medications with food to avoid upset stomach. Choose only one of these medications, do not take them together. Acetaminophen (generic for Tylenol): Should you continue to have additional pain while taking the ibuprofen or naproxen, you may add in acetaminophen as needed. Your daily total maximum amount of acetaminophen from all sources should be limited to 4000mg /day for persons without liver  problems, or 2000mg /day for those with liver problems. Nausea/vomiting: Use the ondansetron (generic for Zofran) for nausea or vomiting.  Cough: Use the cough syrup containing hydrocodone at night, as needed, for cough.  Prednisone: Take the prednisone, as directed, in its entirety. Zyrtec or Claritin: May add these medication daily to control underlying symptoms of congestion, sneezing, and other signs of allergies.  These medications are available over-the-counter. Generics: Cetirizine (generic for Zyrtec) and loratadine (generic for Claritin). Fluticasone: Use fluticasone (generic for Flonase), as directed, for nasal and sinus congestion.  This medication is available over-the-counter. Congestion: Plain guaifenesin (generic for plain Mucinex) may help relieve congestion. Saline sinus rinses and saline nasal sprays may also help relieve congestion. If you do not have high blood pressure, heart problems, or an allergy to such medications, you may also try phenylephrine or Sudafed. Sore throat: Warm liquids or Chloraseptic spray may help soothe a sore throat. Gargle twice a day with a salt water solution made from a half teaspoon of salt in a cup of warm water.  Follow up: Follow up with a primary care provider within the next two weeks should symptoms fail to resolve. Return: Return to the ED for significantly worsening symptoms, shortness of breath, persistent vomiting, large amounts of blood in stool, or any other major concerns.  For prescription assistance, may try using prescription discount sites or apps, such as goodrx.com

## 2018-05-30 LAB — PREGNANCY, URINE: Preg Test, Ur: NEGATIVE

## 2018-05-30 MED ORDER — PROMETHAZINE HCL 25 MG/ML IJ SOLN
25.0000 mg | Freq: Once | INTRAMUSCULAR | Status: DC
  Filled 2018-05-30: qty 1

## 2019-05-28 ENCOUNTER — Ambulatory Visit: Payer: Self-pay | Attending: Internal Medicine

## 2019-05-28 DIAGNOSIS — Z20822 Contact with and (suspected) exposure to covid-19: Secondary | ICD-10-CM

## 2019-05-30 LAB — NOVEL CORONAVIRUS, NAA: SARS-CoV-2, NAA: NOT DETECTED

## 2019-07-04 ENCOUNTER — Other Ambulatory Visit: Payer: Self-pay

## 2019-07-04 ENCOUNTER — Encounter (HOSPITAL_BASED_OUTPATIENT_CLINIC_OR_DEPARTMENT_OTHER): Payer: Self-pay | Admitting: Emergency Medicine

## 2019-07-04 ENCOUNTER — Emergency Department (HOSPITAL_BASED_OUTPATIENT_CLINIC_OR_DEPARTMENT_OTHER)
Admission: EM | Admit: 2019-07-04 | Discharge: 2019-07-04 | Disposition: A | Discharge status: Home / Self Care | Attending: Emergency Medicine | Admitting: Emergency Medicine

## 2019-07-04 DIAGNOSIS — G43919 Migraine, unspecified, intractable, without status migrainosus: Secondary | ICD-10-CM | POA: Insufficient documentation

## 2019-07-04 DIAGNOSIS — J45909 Unspecified asthma, uncomplicated: Secondary | ICD-10-CM | POA: Insufficient documentation

## 2019-07-04 DIAGNOSIS — R519 Headache, unspecified: Secondary | ICD-10-CM

## 2019-07-04 DIAGNOSIS — Z79899 Other long term (current) drug therapy: Secondary | ICD-10-CM | POA: Insufficient documentation

## 2019-07-04 MED ORDER — PROCHLORPERAZINE EDISYLATE 10 MG/2ML IJ SOLN
10.0000 mg | Freq: Once | INTRAMUSCULAR | Status: AC
  Administered 2019-07-04: 11:00:00 10 mg via INTRAVENOUS
  Filled 2019-07-04: qty 2

## 2019-07-04 MED ORDER — SODIUM CHLORIDE 0.9 % IV BOLUS
1000.0000 mL | Freq: Once | INTRAVENOUS | Status: AC
  Administered 2019-07-04: 11:00:00 1000 mL via INTRAVENOUS

## 2019-07-04 MED ORDER — DIPHENHYDRAMINE HCL 50 MG/ML IJ SOLN
25.0000 mg | Freq: Once | INTRAMUSCULAR | Status: AC
  Administered 2019-07-04: 25 mg via INTRAVENOUS
  Filled 2019-07-04: qty 1

## 2019-07-04 MED ORDER — SODIUM CHLORIDE 0.9 % IV BOLUS
1000.0000 mL | Freq: Once | INTRAVENOUS | Status: AC
  Administered 2019-07-04: 16:00:00 1000 mL via INTRAVENOUS

## 2019-07-04 MED ORDER — DEXAMETHASONE SODIUM PHOSPHATE 10 MG/ML IJ SOLN
10.0000 mg | Freq: Once | INTRAMUSCULAR | Status: AC
  Administered 2019-07-04: 11:00:00 10 mg via INTRAVENOUS
  Filled 2019-07-04: qty 1

## 2019-07-04 MED ORDER — KETOROLAC TROMETHAMINE 30 MG/ML IJ SOLN
30.0000 mg | Freq: Once | INTRAMUSCULAR | Status: AC
  Administered 2019-07-04: 30 mg via INTRAVENOUS
  Filled 2019-07-04: qty 1

## 2019-07-04 NOTE — ED Provider Notes (Signed)
34 yo F with a chief complaints of a headache.  Her see the patient in signout from Dr. Sherry Ruffing.  This is her typical migraine she was given a headache cocktail with minimal improvement and so the plan is to reassess after an observation period in the ED.  Reassessed the patient and she does not feel much better she however would like to go home and rest at home.  Would not like me to try any further medications.  We will have her call her neurologist tomorrow and discuss her visit here.   Deno Etienne, DO 07/04/19 1700

## 2019-07-04 NOTE — ED Provider Notes (Signed)
[facility] Provider Note   CSN: [id number] Arrival date & time: 07/04/19  1026     History Chief Complaint  Patient presents with   Migraine    Kendra Graham is a 34 y.o. female.  The history is provided by the patient and medical records. No language interpreter was used.  Migraine This is a chronic problem. The current episode started yesterday. The problem occurs constantly. The problem has been gradually worsening. Pertinent negatives include no chest pain, no abdominal pain, no headaches and no shortness of breath. Nothing aggravates the symptoms. Nothing relieves the symptoms. Treatments tried: home meds. The treatment provided no relief.       Past Medical History:  Diagnosis Date   Arthritic-like pain    Asthma    Pancreatitis     There are no problems to display for this patient.   History reviewed. No pertinent surgical history.   OB History    Gravida  1   Para      Term      Preterm      AB      Living        SAB      TAB      Ectopic      Multiple      Live Births              Family History  Problem Relation Age of Onset   Diabetes Mother    Hyperlipidemia Mother    Hypertension Mother    Cancer Father    Cancer Maternal Grandmother     Social History   Tobacco Use   Smoking status: Never Smoker   Smokeless tobacco: Never Used  Substance Use Topics   Alcohol use: Yes    Comment: social   Drug use: No    Home Medications Prior to Admission medications   Medication Sig Start Date End Date Taking? Authorizing Provider  amitriptyline (ELAVIL) 100 MG tablet Take 100 mg by mouth at bedtime. 05/28/19   [provider]  benzonatate (TESSALON) 100 MG capsule Take 1 capsule (100 mg total) by mouth every 8 (eight) hours. 05/27/18   Petrucelli, Samantha R, PA-C  chlorpheniramine-HYDROcodone (TUSSIONEX PENNKINETIC ER) 10-8 MG/5ML SUER Take 5 mLs by mouth at bedtime as needed for  cough. 05/29/18   Joy, Shawn C, PA-C  fluticasone (FLONASE) 50 MCG/ACT nasal spray Place 1 spray into both nostrils daily. 05/27/18   Petrucelli, Samantha R, PA-C  ibuprofen (ADVIL,MOTRIN) 800 MG tablet Take 1 tablet (800 mg total) by mouth every 8 (eight) hours as needed. 05/27/18   Petrucelli, Samantha R, PA-C  ondansetron (ZOFRAN ODT) 8 MG disintegrating tablet Take 1 tablet (8 mg total) by mouth every 8 (eight) hours as needed for nausea or vomiting. 05/29/18   Joy, Shawn C, PA-C  oseltamivir (TAMIFLU) 75 MG capsule Take 1 capsule (75 mg total) by mouth every 12 (twelve) hours. 05/27/18   Petrucelli, Samantha R, PA-C  predniSONE (STERAPRED UNI-PAK 21 TAB) 10 MG (21) TBPK tablet Take 6 tabs (60mg ) day 1, 5 tabs (50mg ) day 2, 4 tabs (40mg ) day 3, 3 tabs (30mg ) day 4, 2 tabs (20mg ) day 5, and 1 tab (10mg ) day 6. 05/29/18   Joy, Shawn C, PA-C  propranolol (INDERAL) 40 MG tablet Take 40 mg by mouth 2 (two) times daily. 05/28/19   [provider]  rizatriptan (MAXALT) 10 MG tablet SMARTSIG:1 Tablet(s) By Mouth 1 to 2 Times Daily 05/28/19  [provider]    Allergies    Vinyl ether  Review of Systems   Review of Systems  Constitutional: Negative for chills, diaphoresis, fatigue and fever.  HENT: Negative for congestion.   Eyes: Positive for photophobia. Negative for visual disturbance.  Respiratory: Negative for cough, chest tightness, shortness of breath and wheezing.   Cardiovascular: Negative for chest pain.  Gastrointestinal: Negative for abdominal pain, constipation, diarrhea, nausea and vomiting.  Musculoskeletal: Negative for back pain, neck pain and neck stiffness.  Skin: Negative for rash and wound.  Neurological: Positive for weakness and numbness. Negative for dizziness, seizures, syncope, speech difficulty, light-headedness and headaches.  Psychiatric/Behavioral: Negative for agitation and confusion.  All other systems reviewed and are negative.   Physical Exam Updated  Vital Signs BP (!) 142/84 (BP Location: Right Arm)    Pulse 73    Temp 98.4 F (36.9 C) (Oral)    Resp 16    Ht 5\' 3"  (1.6 m)    Wt 49 kg    LMP 07/01/2019    SpO2 100%    BMI 19.15 kg/m   Physical Exam Vitals and nursing note reviewed.  Constitutional:      General: She is not in acute distress.    Appearance: Normal appearance. She is well-developed. She is not ill-appearing, toxic-appearing or diaphoretic.  HENT:     Head: Normocephalic and atraumatic.     Nose: No congestion or rhinorrhea.     Mouth/Throat:     Mouth: Mucous membranes are moist.     Pharynx: No oropharyngeal exudate or posterior oropharyngeal erythema.  Eyes:     Extraocular Movements: Extraocular movements intact.     Conjunctiva/sclera: Conjunctivae normal.     Pupils: Pupils are equal, round, and reactive to light.  Cardiovascular:     Rate and Rhythm: Normal rate and regular rhythm.     Pulses: Normal pulses.     Heart sounds: No murmur.  Pulmonary:     Effort: Pulmonary effort is normal. No respiratory distress.     Breath sounds: Normal breath sounds. No wheezing, rhonchi or rales.  Chest:     Chest wall: No tenderness.  Abdominal:     General: Abdomen is flat.     Palpations: Abdomen is soft.     Tenderness: There is no abdominal tenderness. There is no right CVA tenderness, left CVA tenderness, guarding or rebound.  Musculoskeletal:        General: No tenderness.     Cervical back: Neck supple. No tenderness.  Skin:    General: Skin is warm and dry.     Findings: No erythema or rash.  Neurological:     Mental Status: She is alert and oriented to person, place, and time.     Cranial Nerves: Cranial nerves are intact. No cranial nerve deficit, dysarthria or facial asymmetry.     Sensory: Sensory deficit present.     Motor: Weakness and abnormal muscle tone present. No tremor or seizure activity.     Coordination: Coordination normal. Finger-Nose-Finger Test normal.     Comments: Subjective  numbness in left face, left arm, and left leg.  Mild grip strength decreased in the left compared to right.  Mild leg strength decrease in left compared to right.  No other focal deficit seen on exam.  Psychiatric:        Mood and Affect: Mood normal.     ED Results / Procedures / Treatments   Labs (all labs ordered are listed,  but only abnormal results are displayed) Labs Reviewed - No data to display  EKG None  Radiology No results found.  Procedures Procedures (including critical care time)  Medications Ordered in ED Medications  sodium chloride 0.9 % bolus 1,000 mL (has no administration in time range)  sodium chloride 0.9 % bolus 1,000 mL (0 mLs Intravenous Stopped 07/04/19 1223)  prochlorperazine (COMPAZINE) injection 10 mg (10 mg Intravenous Given 07/04/19 1106)  diphenhydrAMINE (BENADRYL) injection 25 mg (25 mg Intravenous Given 07/04/19 1106)  dexamethasone (DECADRON) injection 10 mg (10 mg Intravenous Given 07/04/19 1106)  ketorolac (TORADOL) 30 MG/ML injection 30 mg (30 mg Intravenous Given 07/04/19 1106)    ED Course  I have reviewed the triage vital signs and the nursing notes.  Pertinent labs & imaging results that were available during my care of the patient were reviewed by me and considered in my medical decision making (see chart for details).    MDM Rules/Calculators/A&P                      [facility] is a 34 y.o. female with a past medical history significant for asthma, prior pancreatitis, and chronic migraines who presents with migraine headache.  She reports that she has had years of severe headaches that are all very similar.  She reports that yesterday she started a migraine headache and her home medications including propranolol and rizatriptan have not helped.  She reports her headache is a 10 out of 10 and consistent with prior.  She reports some nausea but no vomiting.  She reports photosensitivity and sound sensitivity.  She reports she is having  some left-sided numbness and weakness in her left face left arm and left leg which is very similar to prior.  She denies any history of stroke.  She denies any recent trauma.  She denies fevers, chills, neck pain, neck stiffness.  She denies any urinary or other GI symptoms.  She denies any sick contacts or Covid contacts.  She reports she is simply here to get her headache managed.  She reports she is currently on her menstrual cycle and is not pregnant.  On exam, she reported subjective numbness in the left face, left arm, and left leg similar to prior complicated migraines in the past for her.  She also had some mild weakness in left arm and left leg with strength and leg raise subjectively compared to right.  No facial droop seen.  Pupils are symmetric and reactive with normal extraocular movements.  No other focal deficits.  Clear speech.  Abdomen and chest nontender.  Back and flanks nontender.  Patient sitting in the dark due to photophobia.  Had a shared decision made conversation with patient and we agreed to hold on labs or imaging as her symptoms are reportedly exactly the same as prior.  She agrees to hold on MRI or CT of her head given the neurologic symptoms and would rather be treated for the migraine.  This was felt to be reasonable given similar to prior.  Patient reports he normally gets "everything" including steroids, Toradol, Compazine, Benadryl, and fluids.  She will get all of these.  Anticipate reassessment after cocktail to see if she is feeling better.  Anticipate discharge if she is feeling better.  11:17 AM Patient was given the Decadron and she started having a burning sensation.  She reports of burning sensations in her throat.  She did not have stridor or wheezing.  On inspection, no  oropharyngeal edema seen on exam.  Patient was given rest the medications and it started to improve.  Heart rate was initially elevated but then slowly improved.  Suspect this was a reaction  to the administration of the Decadron.  We will continue to watch but due to not feel she has a true allergic reaction at this time.  3:16 PM On reassessment, her headache is now an 8 out of 10 instead of 10 out of 10.  She started to feel better but still is tachycardic.  We will give some more fluids and continue to monitor.  Anticipate she will be stable for discharge home after further hydration and monitoring.  Anticipate her following up with her PCP and neurologist for further management.  Care transferred to oncoming team while awaiting for reassessment.  Anticipate discharge for outpatient follow-up for her chronic migraines.  Final Clinical Impression(s) / ED Diagnoses Final diagnoses:  Bad headache  Intractable migraine without status migrainosus, unspecified migraine type    Clinical Impression: 1. Bad headache   2. Intractable migraine without status migrainosus, unspecified migraine type     Disposition: Care transferred to oncoming team while awaiting for reassessment.  Anticipate discharge for outpatient follow-up for her chronic migraines.  This note was prepared with assistance of Systems analyst. Occasional wrong-word or sound-a-like substitutions may have occurred due to the inherent limitations of voice recognition software.        [facility], Gwenyth Allegra, MD 07/04/19 1517

## 2019-07-04 NOTE — ED Notes (Signed)
ED Provider at bedside. 

## 2019-07-04 NOTE — ED Triage Notes (Signed)
States," I suffer from migraines and I have had one since yesterday"

## 2019-07-04 NOTE — Discharge Instructions (Addendum)
Please discuss with your neurologist your visit here to the ED and see if they want to change your medications or see you in the office.  Please return for worsening headache fever neck stiffness.

## 2019-07-05 ENCOUNTER — Other Ambulatory Visit: Payer: Self-pay

## 2019-07-05 ENCOUNTER — Emergency Department (HOSPITAL_COMMUNITY)

## 2019-07-05 ENCOUNTER — Encounter (HOSPITAL_BASED_OUTPATIENT_CLINIC_OR_DEPARTMENT_OTHER): Payer: Self-pay | Admitting: *Deleted

## 2019-07-05 ENCOUNTER — Emergency Department (HOSPITAL_BASED_OUTPATIENT_CLINIC_OR_DEPARTMENT_OTHER)

## 2019-07-05 ENCOUNTER — Emergency Department (HOSPITAL_BASED_OUTPATIENT_CLINIC_OR_DEPARTMENT_OTHER)
Admission: EM | Admit: 2019-07-05 | Discharge: 2019-07-05 | Disposition: A | Discharge status: Home / Self Care | Attending: Emergency Medicine | Admitting: Emergency Medicine

## 2019-07-05 DIAGNOSIS — R202 Paresthesia of skin: Secondary | ICD-10-CM | POA: Diagnosis not present

## 2019-07-05 DIAGNOSIS — Z3202 Encounter for pregnancy test, result negative: Secondary | ICD-10-CM | POA: Diagnosis not present

## 2019-07-05 DIAGNOSIS — J45909 Unspecified asthma, uncomplicated: Secondary | ICD-10-CM | POA: Insufficient documentation

## 2019-07-05 DIAGNOSIS — R531 Weakness: Secondary | ICD-10-CM | POA: Insufficient documentation

## 2019-07-05 DIAGNOSIS — G43111 Migraine with aura, intractable, with status migrainosus: Secondary | ICD-10-CM | POA: Insufficient documentation

## 2019-07-05 DIAGNOSIS — R519 Headache, unspecified: Secondary | ICD-10-CM | POA: Diagnosis present

## 2019-07-05 DIAGNOSIS — Z79899 Other long term (current) drug therapy: Secondary | ICD-10-CM | POA: Insufficient documentation

## 2019-07-05 DIAGNOSIS — R29898 Other symptoms and signs involving the musculoskeletal system: Secondary | ICD-10-CM

## 2019-07-05 HISTORY — DX: Migraine, unspecified, not intractable, without status migrainosus: G43.909

## 2019-07-05 LAB — BASIC METABOLIC PANEL
Anion gap: 6 (ref 5–15)
BUN: 6 mg/dL (ref 6–20)
CO2: 19 mmol/L — ABNORMAL LOW (ref 22–32)
Calcium: 8.2 mg/dL — ABNORMAL LOW (ref 8.9–10.3)
Chloride: 112 mmol/L — ABNORMAL HIGH (ref 98–111)
Creatinine, Ser: 0.82 mg/dL (ref 0.44–1.00)
GFR calc Af Amer: >60 mL/min (ref >60)
GFR calc non Af Amer: >60 mL/min (ref >60)
Glucose, Bld: 89 mg/dL (ref 70–99)
Potassium: 3.7 mmol/L (ref 3.5–5.1)
Sodium: 137 mmol/L (ref 135–145)

## 2019-07-05 LAB — CBC WITH DIFFERENTIAL/PLATELET
Abs Immature Granulocytes: 0.02 10*3/uL (ref 0.00–0.07)
Basophils Absolute: 0 10*3/uL (ref 0.0–0.1)
Basophils Relative: 0 %
Eosinophils Absolute: 0 10*3/uL (ref 0.0–0.5)
Eosinophils Relative: 0 %
HCT: 34.3 % — ABNORMAL LOW (ref 36.0–46.0)
Hemoglobin: 10.6 g/dL — ABNORMAL LOW (ref 12.0–15.0)
Immature Granulocytes: 0 %
Lymphocytes Relative: 23 %
Lymphs Abs: 2 10*3/uL (ref 0.7–4.0)
MCH: 27.4 pg (ref 26.0–34.0)
MCHC: 30.9 g/dL (ref 30.0–36.0)
MCV: 88.6 fL (ref 80.0–100.0)
Monocytes Absolute: 0.8 10*3/uL (ref 0.1–1.0)
Monocytes Relative: 9 %
Neutro Abs: 6.1 10*3/uL (ref 1.7–7.7)
Neutrophils Relative %: 68 %
Platelets: 168 10*3/uL (ref 150–400)
RBC: 3.87 MIL/uL (ref 3.87–5.11)
RDW: 14.7 % (ref 11.5–15.5)
WBC: 8.9 10*3/uL (ref 4.0–10.5)
nRBC: 0 % (ref 0.0–0.2)

## 2019-07-05 LAB — PREGNANCY, URINE: Preg Test, Ur: NEGATIVE

## 2019-07-05 MED ORDER — SODIUM CHLORIDE 0.9 % IV BOLUS
1000.0000 mL | Freq: Once | INTRAVENOUS | Status: AC
  Administered 2019-07-05: 15:00:00 1000 mL via INTRAVENOUS

## 2019-07-05 MED ORDER — MAGNESIUM SULFATE 2 GM/50ML IV SOLN
2.0000 g | Freq: Once | INTRAVENOUS | Status: AC
  Administered 2019-07-05: 2 g via INTRAVENOUS
  Filled 2019-07-05: qty 50

## 2019-07-05 MED ORDER — IOHEXOL 350 MG/ML SOLN
100.0000 mL | Freq: Once | INTRAVENOUS | Status: AC | PRN
  Administered 2019-07-05: 15:00:00 100 mL via INTRAVENOUS

## 2019-07-05 MED ORDER — KETOROLAC TROMETHAMINE 30 MG/ML IJ SOLN
30.0000 mg | Freq: Once | INTRAMUSCULAR | Status: DC
  Administered 2019-07-05: 16:00:00 30 mg via INTRAVENOUS
  Filled 2019-07-05: qty 1

## 2019-07-05 MED ORDER — DIPHENHYDRAMINE HCL 12.5 MG/5ML PO ELIX
25.0000 mg | ORAL_SOLUTION | Freq: Once | ORAL | Status: DC
  Filled 2019-07-05: qty 10

## 2019-07-05 MED ORDER — DIPHENHYDRAMINE HCL 50 MG/ML IJ SOLN
12.5000 mg | Freq: Once | INTRAMUSCULAR | Status: AC
  Administered 2019-07-05: 12.5 mg via INTRAVENOUS
  Filled 2019-07-05: qty 1

## 2019-07-05 MED ORDER — PROCHLORPERAZINE EDISYLATE 10 MG/2ML IJ SOLN
10.0000 mg | INTRAMUSCULAR | Status: DC | PRN
  Administered 2019-07-05: 10 mg via INTRAVENOUS
  Filled 2019-07-05: qty 2

## 2019-07-05 MED ORDER — METOCLOPRAMIDE HCL 5 MG/ML IJ SOLN
10.0000 mg | Freq: Once | INTRAMUSCULAR | Status: AC
  Administered 2019-07-05: 15:00:00 10 mg via INTRAVENOUS
  Filled 2019-07-05: qty 2

## 2019-07-05 NOTE — ED Provider Notes (Addendum)
[facility] Provider Note   CSN: [id number] Arrival date & time: 07/05/19  1425   History Chief Complaint  Patient presents with  . Migraine   Kendra Graham is a 34 y.o. female with past medical history significant for Migraine who presents for evaluation of migraine. States feels similar to prior migraine HA. Typically get floaters, weakness, numbness to extremities with migraine. Similar to today with numbness to left arm and weakness, No facial droop. Admits to left neck pain as well. Symptoms started 3 days PTA. Seen here in ED yesterday with similar symptoms and patient did not want imaging or labs at that time. Previously seen by Neuro in the past however PCP has been writing scripts for Imitrex. Denies thunderclap HA. Denies fever, chills, N,V, photophobia, phonophobia, CP, SOB, dizziness, lightheadedness, abd pain, unilateral leg swelling, redness or warmth. Admits to syncopal episode earlier today and states normally has syncope episodes with her prior migraines. Rates pain a 10/10. Described as throbbing.   History obtained from patient and past medical records. No interpretor was used.  HPI     Past Medical History:  Diagnosis Date  . Arthritic-like pain   . Asthma   . Migraine   . Pancreatitis     There are no problems to display for this patient.   History reviewed. No pertinent surgical history.   OB History    Gravida  1   Para      Term      Preterm      AB      Living        SAB      TAB      Ectopic      Multiple      Live Births              Family History  Problem Relation Age of Onset  . Diabetes Mother   . Hyperlipidemia Mother   . Hypertension Mother   . Cancer Father   . Cancer Maternal Grandmother     Social History   Tobacco Use  . Smoking status: Never Smoker  . Smokeless tobacco: Never Used  Substance Use Topics  . Alcohol use: Yes    Comment: social  . Drug use: No    Home  Medications Prior to Admission medications   Medication Sig Start Date End Date Taking? Authorizing Provider  amitriptyline (ELAVIL) 100 MG tablet Take 100 mg by mouth at bedtime. 05/28/19   [provider]  benzonatate (TESSALON) 100 MG capsule Take 1 capsule (100 mg total) by mouth every 8 (eight) hours. 05/27/18   Petrucelli, Samantha R, PA-C  chlorpheniramine-HYDROcodone (TUSSIONEX PENNKINETIC ER) 10-8 MG/5ML SUER Take 5 mLs by mouth at bedtime as needed for cough. 05/29/18   Joy, Shawn C, PA-C  fluticasone (FLONASE) 50 MCG/ACT nasal spray Place 1 spray into both nostrils daily. 05/27/18   Petrucelli, Samantha R, PA-C  ibuprofen (ADVIL,MOTRIN) 800 MG tablet Take 1 tablet (800 mg total) by mouth every 8 (eight) hours as needed. 05/27/18   Petrucelli, Samantha R, PA-C  ondansetron (ZOFRAN ODT) 8 MG disintegrating tablet Take 1 tablet (8 mg total) by mouth every 8 (eight) hours as needed for nausea or vomiting. 05/29/18   Joy, Shawn C, PA-C  oseltamivir (TAMIFLU) 75 MG capsule Take 1 capsule (75 mg total) by mouth every 12 (twelve) hours. 05/27/18   Petrucelli, Samantha R, PA-C  predniSONE (STERAPRED UNI-PAK 21 TAB) 10 MG (21) TBPK tablet Take  6 tabs (60mg ) day 1, 5 tabs (50mg ) day 2, 4 tabs (40mg ) day 3, 3 tabs (30mg ) day 4, 2 tabs (20mg ) day 5, and 1 tab (10mg ) day 6. 05/29/18   Joy, Shawn C, PA-C  propranolol (INDERAL) 40 MG tablet Take 40 mg by mouth 2 (two) times daily. 05/28/19   [provider]  rizatriptan (MAXALT) 10 MG tablet SMARTSIG:1 Tablet(s) By Mouth 1 to 2 Times Daily 05/28/19   [provider]    Allergies    Decadron [dexamethasone] and Vinyl ether  Review of Systems   Review of Systems  Constitutional: Negative.   HENT: Negative.   Eyes: Negative for photophobia.  Respiratory: Negative.   Cardiovascular: Negative.   Gastrointestinal: Negative.   Genitourinary: Negative.   Musculoskeletal: Positive for neck pain. Negative for arthralgias, back pain, gait  problem, joint swelling, myalgias and neck stiffness.  Skin: Negative.   Neurological: Positive for syncope, weakness, numbness and headaches. Negative for dizziness, tremors, seizures, facial asymmetry, speech difficulty and light-headedness.  All other systems reviewed and are negative.   Physical Exam Updated Vital Signs BP 107/74 (BP Location: Right Arm)   Pulse 68   Temp 98.2 F (36.8 C) (Oral)   Resp 18   Ht 5\' 3"  (1.6 m)   Wt 22.2 kg   LMP 07/01/2019   SpO2 98%   BMI 8.68 kg/m   Physical Exam Physical Exam  Constitutional: Pt is oriented to person, place, and time. Pt appears well-developed and well-nourished. No distress.  HENT:  Head: Normocephalic and atraumatic.  Mouth/Throat: Oropharynx is clear and moist.  Eyes: Conjunctivae and EOM are normal. Pupils are equal, round, and reactive to light. No scleral icterus.  No horizontal, vertical or rotational nystagmus  Neck: Normal range of motion. Neck supple.  Mild tenderness to palpation over left lateral neck. No bruit Cardiovascular: Normal rate, regular rhythm and intact distal pulses.   Pulmonary/Chest: Effort normal and breath sounds normal. No respiratory distress. Pt has no wheezes. No rales.  Abdominal: Soft. Bowel sounds are normal. There is no tenderness. There is no rebound and no guarding.  Musculoskeletal: Normal range of motion.  Lymphadenopathy:    No cervical adenopathy.  Neurological: Pt. is alert and oriented to person, place, and time. He has normal reflexes. No cranial nerve deficit.  Exhibits normal muscle tone. Coordination normal.  Mental Status:  Alert, oriented, thought content appropriate. Speech fluent without evidence of aphasia. Able to follow 2 step commands without difficulty.  Cranial Nerves:  II:  Peripheral visual fields grossly normal, pupils equal, round, reactive to light III,IV, VI: ptosis not present, extra-ocular motions intact bilaterally  V,VII: smile symmetric, facial light  touch sensation equal VIII: hearing grossly normal bilaterally  IX,X: midline uvula rise  XI: Unequal shoulder shrug on left weaker.  XII: midline tongue extension  Motor:  Asymmetric weakness to left upper extremisty. 5/5 strength to BL lower  dorsiflexion/plantar flexion.  Decreased effort by patient Sensory: Subjective numbness to Left upper extremity and rhob Deep Tendon Reflexes: 2+ and symmetric  Cerebellar: Unable to perform finger to nose on left Gait: normal gait and balance CV: distal pulses palpable throughout   Skin: Skin is warm and dry. No rash noted. Pt is not diaphoretic.  Psychiatric: Pt has a normal mood and affect. Behavior is normal. Judgment and thought content normal.  Nursing note and vitals reviewed. ED Results / Procedures / Treatments   Labs (all labs ordered are listed, but only abnormal results are displayed)  Labs Reviewed  CBC WITH DIFFERENTIAL/PLATELET - Abnormal; Notable for the following components:      Result Value   Hemoglobin 10.6 (*)    HCT 34.3 (*)    All other components within normal limits  BASIC METABOLIC PANEL - Abnormal; Notable for the following components:   Chloride 112 (*)    CO2 19 (*)    Calcium 8.2 (*)    All other components within normal limits  PREGNANCY, URINE    EKG None  Radiology CT Angio Head W or Wo Contrast  Result Date: 07/05/2019 CLINICAL DATA:  Headache EXAM: CT ANGIOGRAPHY HEAD AND NECK TECHNIQUE: Multidetector CT imaging of the head and neck was performed using the standard protocol during bolus administration of intravenous contrast. Multiplanar CT image reconstructions and MIPs were obtained to evaluate the vascular anatomy. Carotid stenosis measurements (when applicable) are obtained utilizing NASCET criteria, using the distal internal carotid diameter as the denominator. CONTRAST:  138mL OMNIPAQUE IOHEXOL 350 MG/ML SOLN COMPARISON:  Head CT July 20, 2012 FINDINGS: CT HEAD FINDINGS Brain: No evidence of acute  infarction, hemorrhage, hydrocephalus, extra-axial collection or mass lesion/mass effect. Vascular: No hyperdense vessel or unexpected calcification. Skull: Normal. Negative for fracture or focal lesion. Sinuses: Imaged portions are clear. Orbits: No acute finding. Review of the MIP images confirms the above findings CTA NECK FINDINGS Aortic arch: There is common origin of the innominate and left common carotid artery. Imaged portion shows no evidence of aneurysm or dissection. No significant stenosis of the major arch vessel origins. Right carotid system: No evidence of dissection, stenosis (50% or greater) or occlusion. Left carotid system: No evidence of dissection, stenosis (50% or greater) or occlusion. Vertebral arteries: Codominant. No evidence of dissection, stenosis (50% or greater) or occlusion. Skeleton: Negative Other neck: Negative Upper chest: Unremarkable Review of the MIP images confirms the above findings CTA HEAD FINDINGS Anterior circulation: No significant stenosis, proximal occlusion, aneurysm, or vascular malformation. Posterior circulation: No significant stenosis, proximal occlusion, aneurysm, or vascular malformation. Venous sinuses: As permitted by contrast timing, patent. Anatomic variants: No clinically of relevant anatomical variations. Review of the MIP images confirms the above findings IMPRESSION: 1. Normal brain. 2. Normal CTA of the neck. Electronically Signed   By: Pedro Earls M.D.   On: 07/05/2019 15:20   CT Angio Neck W and/or Wo Contrast  Result Date: 07/05/2019 CLINICAL DATA:  Headache EXAM: CT ANGIOGRAPHY HEAD AND NECK TECHNIQUE: Multidetector CT imaging of the head and neck was performed using the standard protocol during bolus administration of intravenous contrast. Multiplanar CT image reconstructions and MIPs were obtained to evaluate the vascular anatomy. Carotid stenosis measurements (when applicable) are obtained utilizing NASCET criteria, using  the distal internal carotid diameter as the denominator. CONTRAST:  154mL OMNIPAQUE IOHEXOL 350 MG/ML SOLN COMPARISON:  Head CT July 20, 2012 FINDINGS: CT HEAD FINDINGS Brain: No evidence of acute infarction, hemorrhage, hydrocephalus, extra-axial collection or mass lesion/mass effect. Vascular: No hyperdense vessel or unexpected calcification. Skull: Normal. Negative for fracture or focal lesion. Sinuses: Imaged portions are clear. Orbits: No acute finding. Review of the MIP images confirms the above findings CTA NECK FINDINGS Aortic arch: There is common origin of the innominate and left common carotid artery. Imaged portion shows no evidence of aneurysm or dissection. No significant stenosis of the major arch vessel origins. Right carotid system: No evidence of dissection, stenosis (50% or greater) or occlusion. Left carotid system: No evidence of dissection, stenosis (50% or greater) or occlusion. Vertebral  arteries: Codominant. No evidence of dissection, stenosis (50% or greater) or occlusion. Skeleton: Negative Other neck: Negative Upper chest: Unremarkable Review of the MIP images confirms the above findings CTA HEAD FINDINGS Anterior circulation: No significant stenosis, proximal occlusion, aneurysm, or vascular malformation. Posterior circulation: No significant stenosis, proximal occlusion, aneurysm, or vascular malformation. Venous sinuses: As permitted by contrast timing, patent. Anatomic variants: No clinically of relevant anatomical variations. Review of the MIP images confirms the above findings IMPRESSION: 1. Normal brain. 2. Normal CTA of the neck. Electronically Signed   By: Pedro Earls M.D.   On: 07/05/2019 15:20    Procedures Procedures (including critical care time)  Medications Ordered in ED Medications  prochlorperazine (COMPAZINE) injection 10 mg (10 mg Intravenous Given 07/05/19 1753)  metoCLOPramide (REGLAN) injection 10 mg (10 mg Intravenous Given 07/05/19 1513)    sodium chloride 0.9 % bolus 1,000 mL (0 mLs Intravenous Stopped 07/05/19 1647)  iohexol (OMNIPAQUE) 350 MG/ML injection 100 mL (100 mLs Intravenous Contrast Given 07/05/19 1451)  diphenhydrAMINE (BENADRYL) injection 12.5 mg (12.5 mg Intravenous Given 07/05/19 1517)  magnesium sulfate IVPB 2 g 50 mL (0 g Intravenous Stopped 07/05/19 1610)   ED Course  I have reviewed the triage vital signs and the nursing notes.  Pertinent labs & imaging results that were available during my care of the patient were reviewed by me and considered in my medical decision making (see chart for details).  34 year old presents for evaluation of HA, similar to prior migraines. Seen yesterday for similar. Patient with HA, left neck pain, weakness and subjective paresthesias to left upper extremities however normal lower extremities. No facial droop, difficulty with work finding, vision changes. Patient states presentation similar to prior HA. Not followed by Neurology currently. Patient outside code stroke window and Tpa window given symptoms x 3 days. Plan for imaging, labs, migraine cocktail and reevaluation.  CTA head and neck without acute findings Labs without significant findings.  1615: Patient reassessed. States migraine without any relief.  1652: Continued pain and patient states that her HA is worsening. Will consult with Neurology given continued HA and neuro deficits.  1750: Consult with Dr. Leonel Ramsay with Neurology.  Given continued headache as well as subjective paresthesias requests to transfer to [facility] emergency department for MRI. Concern for migrainous stroke per Neuro vs complicated migraine. Recommends 10 mg Compazine here in ED as well as repeat dose at 4 hours.  If patient with continued migraine neurologic deficits after 2 total doses of Compazine neurology can be reconsulted for disposition.  If MRI negative, HA improved patient may be discharged home with outpatient follow-up with  neurology.   1755: Went to reassess patient and discuss transfer to [facility] for MRI. Continues to state her HA pain is a 10/10.  Patient states she is unable to move her left arm and continues to have paresthesias to LUE. Patient's repeat neuro exam with limited effort by patient. On one hand she states she cannot move her left upper extremity however upon her cell phone ringing patient visualized picking up her cell phone with her left hand and putting it to her ear to talk with someone with good grip to left hand on cell phone. Question effort depend exam?  CONSULT with Dr. Langston Masker EDP who agrees to accept patient in transfer to [facility] ED.  Patient to be transferred to [facility] instable condition via [facility]. Disposition TBD by [facility] EDP.  Patient seen evaluated by Dr. Wilson Singer upon arrival to ED.  MDM Rules/Calculators/A&P                       Final Clinical Impression(s) / ED Diagnoses Final diagnoses:  Intractable migraine with aura with status migrainosus  Weakness of left upper extremity  Paresthesias    Rx / DC Orders ED Discharge Orders    None       Gweneth Fredlund A, PA-C 07/05/19 1803    Ambers Iyengar A, PA-C 07/05/19 1804    Ayomikun Starling A, PA-C 07/05/19 2043    Virgel Manifold, MD 07/07/19 [phone]

## 2019-07-05 NOTE — ED Notes (Signed)
Pt went to MRI.

## 2019-07-05 NOTE — ED Triage Notes (Signed)
Seen here lt night for same , c/o migraine x 3 days cont

## 2019-07-05 NOTE — ED Provider Notes (Signed)
Blood pressure 113/77, pulse 62, temperature 98.2 F (36.8 C), temperature source Oral, resp. rate 18, height 5\' 3"  (1.6 m), weight 49 kg, last menstrual period 07/01/2019, SpO2 100 %.   In short, Kendra Graham is a 34 y.o. female with a chief complaint of Migraine .  Refer to the original H&P for additional details.  Patient arrives to the ED from [facility] with plan for MRI and reassess.  10:57 PM  MRI image is non-diagnostic. Patient's neuro deficits have completely resolved. She has normal strength and sensation in the bilateral upper and lower extremities. Spoke with Dr. Cheral Marker with neuro regarding case. With symptoms now resolved, will refer to neurology as an outpatient with strict return precautions.      Margette Fast, MD 07/05/19 2259

## 2019-07-05 NOTE — ED Notes (Signed)
All appropriate discharge materials reviewed at length with patient. Time for questions provided. Pt has no other questions at this time and verbalizes understanding of all provided materials.  

## 2019-07-05 NOTE — Discharge Instructions (Signed)
You were seen in the ED today with headache and weakness. This was likely a type of migraine and not a stroke but if your weakness/numbness returns you should return to the ED. Follow up with your PCP and I have referred you to a Neurologist for further follow up. Please call tomorrow to schedule a follow up appointment.

## 2019-12-05 ENCOUNTER — Encounter (HOSPITAL_BASED_OUTPATIENT_CLINIC_OR_DEPARTMENT_OTHER): Payer: Self-pay | Admitting: Emergency Medicine

## 2019-12-05 ENCOUNTER — Other Ambulatory Visit: Payer: Self-pay

## 2019-12-05 ENCOUNTER — Emergency Department (HOSPITAL_BASED_OUTPATIENT_CLINIC_OR_DEPARTMENT_OTHER)
Admission: EM | Admit: 2019-12-05 | Discharge: 2019-12-05 | Disposition: A | Discharge status: Home / Self Care | Payer: Medicaid Other | Attending: Emergency Medicine | Admitting: Emergency Medicine

## 2019-12-05 DIAGNOSIS — J45909 Unspecified asthma, uncomplicated: Secondary | ICD-10-CM | POA: Diagnosis not present

## 2019-12-05 DIAGNOSIS — G43909 Migraine, unspecified, not intractable, without status migrainosus: Secondary | ICD-10-CM | POA: Insufficient documentation

## 2019-12-05 DIAGNOSIS — Z79899 Other long term (current) drug therapy: Secondary | ICD-10-CM | POA: Diagnosis not present

## 2019-12-05 DIAGNOSIS — R519 Headache, unspecified: Secondary | ICD-10-CM | POA: Diagnosis present

## 2019-12-05 LAB — PREGNANCY, URINE: Preg Test, Ur: NEGATIVE

## 2019-12-05 MED ORDER — KETOROLAC TROMETHAMINE 15 MG/ML IJ SOLN
15.0000 mg | Freq: Once | INTRAMUSCULAR | Status: AC
  Administered 2019-12-05: 15 mg via INTRAVENOUS
  Filled 2019-12-05: qty 1

## 2019-12-05 MED ORDER — PROCHLORPERAZINE EDISYLATE 10 MG/2ML IJ SOLN
10.0000 mg | Freq: Once | INTRAMUSCULAR | Status: AC
  Administered 2019-12-05: 10 mg via INTRAVENOUS
  Filled 2019-12-05: qty 2

## 2019-12-05 MED ORDER — DIPHENHYDRAMINE HCL 50 MG/ML IJ SOLN
12.5000 mg | Freq: Once | INTRAMUSCULAR | Status: AC
  Administered 2019-12-05: 12.5 mg via INTRAVENOUS
  Filled 2019-12-05: qty 1

## 2019-12-05 NOTE — ED Notes (Signed)
States at 1400hrs today began having a HA as she has had in the past, began having nausea and vomiting and what she describes as "floaters" with vision. Has photophobia. ED PA at bedside

## 2019-12-05 NOTE — Discharge Instructions (Addendum)
As discussed, I have placed a referral to a neurologist.  Expect a phone call within the next week to schedule an appointment.  Continue to take your home medications as prescribed.  Return to the ER if you develop worsening symptoms.  Please follow-up with PCP if symptoms not improved within the next week.

## 2019-12-05 NOTE — ED Notes (Signed)
Medicated per orders, sr x 2 up, call bell within reach, pt instructed not to get up without staff in room.

## 2019-12-05 NOTE — ED Triage Notes (Signed)
Pt c/o migraine headache onset about 2 pm today.  Pt tried prescribed home medications without relief. Pt reports N/V and vision changes to right eye.

## 2019-12-05 NOTE — ED Notes (Signed)
BEFAST negative, VAN negative

## 2019-12-05 NOTE — ED Provider Notes (Signed)
[facility] Provider Note   CSN: [id number] Arrival date & time: 12/05/19  1746     History Chief Complaint  Patient presents with   Migraine    Kendra Graham is a 34 y.o. female with a past medical history significant for asthma, history of pancreatitis, and history of chronic migraines who presents to the ED due to a persistent, right-sided, throbbing headache that started around 2 PM today.  Patient states this is similar to all of her migraines.  She denies any change in character.  Denies worst headache of her life and worst intensity at onset.  She admits to seeing "floaters" in her right eye which is true of all her migraines. Denies blurry vision. Admits to some photophobia, nausea, and 1 episode of non-bloody, non-bilious emesis on the way to the ED. She took 2 Maxalts prior to arrival with no improvement in symptoms which caused her to report to the ED. Rates her pain a 10/10. Denies head injury. Denies fever, chills, and neck pain. She does not currently have a neurologist.  Denies speech changes, facial droop, and unilateral weakness.  History obtained from patient and past medical records. No interpreter used during encounter.      Past Medical History:  Diagnosis Date   Arthritic-like pain    Asthma    Migraine    Pancreatitis     There are no problems to display for this patient.   History reviewed. No pertinent surgical history.   OB History    Gravida  1   Para      Term      Preterm      AB      Living        SAB      TAB      Ectopic      Multiple      Live Births              Family History  Problem Relation Age of Onset   Diabetes Mother    Hyperlipidemia Mother    Hypertension Mother    Cancer Father    Cancer Maternal Grandmother     Social History   Tobacco Use   Smoking status: Never Smoker   Smokeless tobacco: Never Used  Vaping Use   Vaping Use: Never used  Substance Use  Topics   Alcohol use: Yes    Comment: social   Drug use: No    Home Medications Prior to Admission medications   Medication Sig Start Date End Date Taking? Authorizing Provider  amitriptyline (ELAVIL) 100 MG tablet Take 100 mg by mouth at bedtime. 05/28/19   [provider]  benzonatate (TESSALON) 100 MG capsule Take 1 capsule (100 mg total) by mouth every 8 (eight) hours. 05/27/18   Petrucelli, Samantha R, PA-C  chlorpheniramine-HYDROcodone (TUSSIONEX PENNKINETIC ER) 10-8 MG/5ML SUER Take 5 mLs by mouth at bedtime as needed for cough. 05/29/18   Joy, Shawn C, PA-C  fluticasone (FLONASE) 50 MCG/ACT nasal spray Place 1 spray into both nostrils daily. 05/27/18   Petrucelli, Samantha R, PA-C  ibuprofen (ADVIL,MOTRIN) 800 MG tablet Take 1 tablet (800 mg total) by mouth every 8 (eight) hours as needed. 05/27/18   Petrucelli, Samantha R, PA-C  ondansetron (ZOFRAN ODT) 8 MG disintegrating tablet Take 1 tablet (8 mg total) by mouth every 8 (eight) hours as needed for nausea or vomiting. 05/29/18   Joy, Shawn C, PA-C  oseltamivir (TAMIFLU) 75 MG capsule  Take 1 capsule (75 mg total) by mouth every 12 (twelve) hours. 05/27/18   Petrucelli, Samantha R, PA-C  predniSONE (STERAPRED UNI-PAK 21 TAB) 10 MG (21) TBPK tablet Take 6 tabs (60mg ) day 1, 5 tabs (50mg ) day 2, 4 tabs (40mg ) day 3, 3 tabs (30mg ) day 4, 2 tabs (20mg ) day 5, and 1 tab (10mg ) day 6. 05/29/18   Joy, Shawn C, PA-C  propranolol (INDERAL) 40 MG tablet Take 40 mg by mouth 2 (two) times daily. 05/28/19   [provider]  rizatriptan (MAXALT) 10 MG tablet SMARTSIG:1 Tablet(s) By Mouth 1 to 2 Times Daily 05/28/19   [provider]    Allergies    Decadron [dexamethasone] and Vinyl ether  Review of Systems   Review of Systems  Constitutional: Negative for chills and fever.  Eyes: Positive for photophobia and visual disturbance. Negative for pain.  Musculoskeletal: Negative for neck pain.  Neurological: Positive for headaches.  Negative for dizziness, facial asymmetry, weakness and numbness.  All other systems reviewed and are negative.   Physical Exam Updated Vital Signs BP (!) 114/97 (BP Location: Left Arm)    Pulse 74    Temp 98.8 F (37.1 C) (Oral)    Ht 5\' 3"  (1.6 m)    Wt 54.4 kg    LMP 11/15/2019    SpO2 100%    BMI 21.26 kg/m   Physical Exam Vitals and nursing note reviewed.  Constitutional:      General: She is not in acute distress.    Appearance: She is not ill-appearing.  HENT:     Head: Normocephalic.  Eyes:     Extraocular Movements: Extraocular movements intact.     Pupils: Pupils are equal, round, and reactive to light.  Neck:     Comments: No meningismus. Cardiovascular:     Rate and Rhythm: Normal rate and regular rhythm.     Pulses: Normal pulses.     Heart sounds: Normal heart sounds. No murmur heard.  No friction rub. No gallop.   Pulmonary:     Effort: Pulmonary effort is normal.     Breath sounds: Normal breath sounds.  Abdominal:     General: Abdomen is flat. Bowel sounds are normal. There is no distension.     Palpations: Abdomen is soft.     Tenderness: There is no abdominal tenderness. There is no guarding or rebound.  Musculoskeletal:     Cervical back: Neck supple.     Comments: Able to move all 4 extremities without difficulty.   Skin:    General: Skin is warm and dry.  Neurological:     General: No focal deficit present.     Mental Status: She is alert.     Comments: Speech is clear, able to follow commands CN III-XII intact Normal strength in upper and lower extremities bilaterally including dorsiflexion and plantar flexion, strong and equal grip strength Sensation grossly intact throughout Moves extremities without ataxia, coordination intact No pronator drift Ambulates without difficulty  Psychiatric:        Mood and Affect: Mood normal.        Behavior: Behavior normal.     ED Results / Procedures / Treatments   Labs (all labs ordered are listed,  but only abnormal results are displayed) Labs Reviewed  PREGNANCY, URINE    EKG None  Radiology No results found.  Procedures Procedures (including critical care time)  Medications Ordered in ED Medications  diphenhydrAMINE (BENADRYL) injection 12.5 mg (12.5 mg Intravenous Given  12/05/19 1818)  prochlorperazine (COMPAZINE) injection 10 mg (10 mg Intravenous Given 12/05/19 1818)  ketorolac (TORADOL) 15 MG/ML injection 15 mg (15 mg Intravenous Given 12/05/19 1838)    ED Course  I have reviewed the triage vital signs and the nursing notes.  Pertinent labs & imaging results that were available during my care of the patient were reviewed by me and considered in my medical decision making (see chart for details).  Clinical Course as of Dec 04 2005  Sun Dec 05, 2019  1826 Preg Test, Ur: NEGATIVE [CA]    Clinical Course User Index [CA] Karie Kirks   MDM Rules/Calculators/A&P                         34 year old female presents to the ED due a migraine headache.  Patient has a history of chronic migraines and notes this feels similar to all of her past episodes.  Denies worst headache of her life and worst intensity at onset.  Upon arrival, patient is afebrile, not tachycardic or hypoxic.  Patient in no acute distress and nontoxic-appearing.  Physical exam reassuring.  Normal neurological exam.  No meningismus to suggest meningitis.  Shared decision making in regards to CT head with patient.  Patient deferred CT at this time given that there is no change in characteristic from all of her other migraines which I find to be reasonable given my suspicion for [facility] or other emergent intracranial abnormalities is low.  Will treat with migraine cocktail and reassess patient.  7:15 PM reassessed patient at bedside who notes mild improvement in symptoms.  We will continue to monitor for symptomatic relief.  8:04 PM reassessed patient at bedside who notes complete resolution in headache.   Patient notes she is ready to go home.  Ambulatory referral placed for neurology.  Instructed patient that she should receive a call within the next week to schedule an appointment with a neurologist.  Instructed patient to take her at home migraine medication as prescribed if her headache returns. Strict ED precautions discussed with patient. Patient states understanding and agrees to plan. Patient discharged home in no acute distress and stable vitals  Final Clinical Impression(s) / ED Diagnoses Final diagnoses:  Migraine without status migrainosus, not intractable, unspecified migraine type    Rx / DC Orders ED Discharge Orders         Ordered    Ambulatory referral to Neurology     Discontinue  Reprint    Comments: An appointment is requested in approximately: for migraines earliest available   12/05/19 2006           Karie Kirks 12/05/19 2009    Blanchie Dessert, MD 12/06/19 2129

## 2020-01-20 ENCOUNTER — Other Ambulatory Visit: Payer: Self-pay

## 2020-01-20 ENCOUNTER — Encounter (HOSPITAL_BASED_OUTPATIENT_CLINIC_OR_DEPARTMENT_OTHER): Payer: Self-pay | Admitting: *Deleted

## 2020-01-20 ENCOUNTER — Emergency Department (HOSPITAL_BASED_OUTPATIENT_CLINIC_OR_DEPARTMENT_OTHER)
Admission: EM | Admit: 2020-01-20 | Discharge: 2020-01-20 | Disposition: A | Discharge status: Home / Self Care | Payer: Medicaid Other | Attending: Emergency Medicine | Admitting: Emergency Medicine

## 2020-01-20 DIAGNOSIS — R519 Headache, unspecified: Secondary | ICD-10-CM | POA: Diagnosis present

## 2020-01-20 DIAGNOSIS — J45909 Unspecified asthma, uncomplicated: Secondary | ICD-10-CM | POA: Insufficient documentation

## 2020-01-20 DIAGNOSIS — G43901 Migraine, unspecified, not intractable, with status migrainosus: Secondary | ICD-10-CM

## 2020-01-20 DIAGNOSIS — Z79899 Other long term (current) drug therapy: Secondary | ICD-10-CM | POA: Diagnosis not present

## 2020-01-20 LAB — PREGNANCY, URINE: Preg Test, Ur: NEGATIVE

## 2020-01-20 MED ORDER — SODIUM CHLORIDE 0.9 % IV SOLN
INTRAVENOUS | Status: DC | PRN
  Administered 2020-01-20: 250 mL via INTRAVENOUS

## 2020-01-20 MED ORDER — MORPHINE SULFATE (PF) 4 MG/ML IV SOLN
4.0000 mg | Freq: Once | INTRAVENOUS | Status: AC
  Administered 2020-01-20: 4 mg via INTRAVENOUS
  Filled 2020-01-20: qty 1

## 2020-01-20 MED ORDER — KETOROLAC TROMETHAMINE 15 MG/ML IJ SOLN
15.0000 mg | Freq: Once | INTRAMUSCULAR | Status: AC
  Administered 2020-01-20: 15 mg via INTRAVENOUS
  Filled 2020-01-20: qty 1

## 2020-01-20 MED ORDER — DIPHENHYDRAMINE HCL 50 MG/ML IJ SOLN
12.5000 mg | Freq: Once | INTRAMUSCULAR | Status: AC
  Administered 2020-01-20: 12.5 mg via INTRAVENOUS
  Filled 2020-01-20: qty 1

## 2020-01-20 MED ORDER — MAGNESIUM SULFATE 2 GM/50ML IV SOLN: 2.0000 g | INTRAVENOUS | Status: DC

## 2020-01-20 MED ORDER — PROCHLORPERAZINE EDISYLATE 10 MG/2ML IJ SOLN
5.0000 mg | Freq: Once | INTRAMUSCULAR | Status: AC
  Administered 2020-01-20: 5 mg via INTRAVENOUS
  Filled 2020-01-20: qty 2

## 2020-01-20 MED ORDER — DROPERIDOL 2.5 MG/ML IJ SOLN
1.2500 mg | Freq: Once | INTRAMUSCULAR | Status: AC
  Administered 2020-01-20: 1.25 mg via INTRAVENOUS
  Filled 2020-01-20: qty 2

## 2020-01-20 MED ORDER — MAGNESIUM SULFATE 2 GM/50ML IV SOLN
2.0000 g | Freq: Once | INTRAVENOUS | Status: AC
  Administered 2020-01-20: 2 g via INTRAVENOUS
  Filled 2020-01-20: qty 50

## 2020-01-20 MED ORDER — SODIUM CHLORIDE 0.9 % IV BOLUS
500.0000 mL | Freq: Once | INTRAVENOUS | Status: AC
  Administered 2020-01-20: 500 mL via INTRAVENOUS

## 2020-01-20 NOTE — ED Provider Notes (Addendum)
[facility] Provider Note   CSN: [id number] Arrival date & time: 01/20/20  1222     History Chief Complaint  Patient presents with   Headache    Kendra Graham is a 34 y.o. female.  `  The history is provided by the patient and medical records.  Headache Pain location:  Generalized Quality:  Dull Radiates to:  Does not radiate Severity currently:  10/10 Severity at highest:  10/10 Onset quality:  Gradual Duration:  8 hours Timing:  Constant Progression:  Unchanged Chronicity:  Recurrent Similar to prior headaches: yes   Relieved by:  Nothing Worsened by:  Activity, light and sound Ineffective treatments:  Resting in a darkened room, prescription medications, NSAIDs and acetaminophen      Past Medical History:  Diagnosis Date   Arthritic-like pain    Asthma    Migraine    Pancreatitis     There are no problems to display for this patient.   History reviewed. No pertinent surgical history.   OB History    Gravida  1   Para      Term      Preterm      AB      Living        SAB      TAB      Ectopic      Multiple      Live Births              Family History  Problem Relation Age of Onset   Diabetes Mother    Hyperlipidemia Mother    Hypertension Mother    Cancer Father    Cancer Maternal Grandmother     Social History   Tobacco Use   Smoking status: Never Smoker   Smokeless tobacco: Never Used  Vaping Use   Vaping Use: Never used  Substance Use Topics   Alcohol use: Yes    Comment: social   Drug use: No    Home Medications Prior to Admission medications   Medication Sig Start Date End Date Taking? Authorizing Provider  amitriptyline (ELAVIL) 100 MG tablet Take 100 mg by mouth at bedtime. 05/28/19   [provider]  benzonatate (TESSALON) 100 MG capsule Take 1 capsule (100 mg total) by mouth every 8 (eight) hours. 05/27/18   Petrucelli, Samantha R, PA-C    chlorpheniramine-HYDROcodone (TUSSIONEX PENNKINETIC ER) 10-8 MG/5ML SUER Take 5 mLs by mouth at bedtime as needed for cough. 05/29/18   Joy, Shawn C, PA-C  fluticasone (FLONASE) 50 MCG/ACT nasal spray Place 1 spray into both nostrils daily. 05/27/18   Petrucelli, Samantha R, PA-C  ibuprofen (ADVIL,MOTRIN) 800 MG tablet Take 1 tablet (800 mg total) by mouth every 8 (eight) hours as needed. 05/27/18   Petrucelli, Samantha R, PA-C  ondansetron (ZOFRAN ODT) 8 MG disintegrating tablet Take 1 tablet (8 mg total) by mouth every 8 (eight) hours as needed for nausea or vomiting. 05/29/18   Joy, Shawn C, PA-C  oseltamivir (TAMIFLU) 75 MG capsule Take 1 capsule (75 mg total) by mouth every 12 (twelve) hours. 05/27/18   Petrucelli, Samantha R, PA-C  predniSONE (STERAPRED UNI-PAK 21 TAB) 10 MG (21) TBPK tablet Take 6 tabs (60mg ) day 1, 5 tabs (50mg ) day 2, 4 tabs (40mg ) day 3, 3 tabs (30mg ) day 4, 2 tabs (20mg ) day 5, and 1 tab (10mg ) day 6. 05/29/18   Joy, Shawn C, PA-C  propranolol (INDERAL) 40 MG tablet Take 40 mg by mouth  2 (two) times daily. 05/28/19   [provider]  rizatriptan (MAXALT) 10 MG tablet SMARTSIG:1 Tablet(s) By Mouth 1 to 2 Times Daily 05/28/19   [provider]    Allergies    Decadron [dexamethasone] and Vinyl ether  Review of Systems   Review of Systems  Neurological: Positive for headaches.   Ten systems reviewed and are negative for acute change, except as noted in the HPI.   Physical Exam Updated Vital Signs BP 109/74 (BP Location: Left Arm)    Pulse 62    Temp 98.4 F (36.9 C) (Oral)    Resp 16    Ht 5\' 3"  (1.6 m)    Wt 54.4 kg    LMP 01/03/2020    SpO2 100%    BMI 21.24 kg/m   Physical Exam Vitals and nursing note reviewed.  Constitutional:      General: She is not in acute distress.    Appearance: She is well-developed. She is not diaphoretic.  HENT:     Head: Normocephalic and atraumatic.  Eyes:     General: No scleral icterus.    Conjunctiva/sclera:  Conjunctivae normal.     Pupils: Pupils are equal, round, and reactive to light.     Comments: No horizontal, vertical or rotational nystagmus  Neck:     Comments: Full active and passive ROM without pain No midline or paraspinal tenderness No nuchal rigidity or meningeal signs Cardiovascular:     Rate and Rhythm: Normal rate and regular rhythm.  Pulmonary:     Effort: Pulmonary effort is normal. No respiratory distress.     Breath sounds: Normal breath sounds. No wheezing or rales.  Abdominal:     General: Bowel sounds are normal.     Palpations: Abdomen is soft.     Tenderness: There is no abdominal tenderness. There is no guarding or rebound.  Musculoskeletal:        General: Normal range of motion.     Cervical back: Normal range of motion and neck supple.  Lymphadenopathy:     Cervical: No cervical adenopathy.  Skin:    General: Skin is warm and dry.     Findings: No rash.  Neurological:     Mental Status: She is alert and oriented to person, place, and time.     Cranial Nerves: No cranial nerve deficit.     Motor: No abnormal muscle tone.     Coordination: Coordination normal.     Comments: Mental Status:  Alert, oriented, thought content appropriate. Speech fluent without evidence of aphasia. Able to follow 2 step commands without difficulty.  Cranial Nerves:  II:  Peripheral visual fields grossly normal, pupils equal, round, reactive to light III,IV, VI: ptosis not present, extra-ocular motions intact bilaterally  V,VII: smile symmetric, facial light touch sensation equal VIII: hearing grossly normal bilaterally  IX,X: midline uvula rise  XI: bilateral shoulder shrug equal and strong XII: midline tongue extension  Motor:  5/5 in upper and lower extremities bilaterally including strong and equal grip strength and dorsiflexion/plantar flexion Sensory: Pinprick and light touch normal in all extremities.  Cerebellar: normal finger-to-nose with bilateral upper  extremities Gait: normal gait and balance CV: distal pulses palpable throughout   Psychiatric:        Behavior: Behavior normal.        Thought Content: Thought content normal.        Judgment: Judgment normal.     ED Results / Procedures / Treatments   Labs (all  labs ordered are listed, but only abnormal results are displayed) Labs Reviewed  PREGNANCY, URINE    EKG None  Radiology No results found.  Procedures Procedures (including critical care time)  Medications Ordered in ED Medications  sodium chloride 0.9 % bolus 500 mL (500 mLs Intravenous New Bag/Given 01/20/20 1847)  prochlorperazine (COMPAZINE) injection 5 mg (5 mg Intravenous Given 01/20/20 1845)  diphenhydrAMINE (BENADRYL) injection 12.5 mg (12.5 mg Intravenous Given 01/20/20 1843)  ketorolac (TORADOL) 15 MG/ML injection 15 mg (15 mg Intravenous Given 01/20/20 1844)    ED Course  I have reviewed the triage vital signs and the nursing notes.  Pertinent labs & imaging results that were available during my care of the patient were reviewed by me and considered in my medical decision making (see chart for details).  Clinical Course as of Jan 21 1151  Thu Jan 20, 2020  3785 Patient rates her headache pain at a 9/10 from a 10/10.  Review of EMR shows that the patient frequently has status migrainosus with intractable features.  Will add the morphine and magnesium and reevaluate.   [AH]    Clinical Course User Index [AH] Margarita Mail, PA-C   MDM Rules/Calculators/A&P                          Iyari Elderkin presents with headache Given the large differential diagnosis for Miciah Slaby, the decision making in this case is of high complexity.  I reviewed labs which shows a negative UP test.  After evaluating all of the data points in this case, the presentation of Ximena Adel is NOT consistent with skull fracture, meningitis/encephalitis, SAH/sentinel bleed, Intracranial Hemorrhage (ICH) (subdural/epidural), acute  obstructive hydrocephalus, space occupying lesions, CVA, CO Poisoning, Basilar/vertebral artery dissection, preeclampsia, cerebral venous thrombosis, hypertensive emergency, temporal Arteritis, Idiopathic Intracranial Hypertension (pseudotumor cerebri).  Strict return and follow-up precautions have been given by me personally or by detailed written instructions verbalized by nursing staff using the teach back method to patient/family/caregiver.  Data Reviewed/Counseling: I have reviewed the patient's vital signs, nursing notes, and other relevant tests/information. I had a detailed discussion regarding the historical points, exam findings, and any diagnostic results supporting the discharge diagnosis. I also discussed the need for outpatient follow-up and the need to return to the ED if symptoms worsen or if there are any questions or concerns that arise at [facility]  Final Clinical Impression(s) / ED Diagnoses Final diagnoses:  None    Rx / DC Orders ED Discharge Orders    None       Margarita Mail, PA-C 01/21/20 1152    Wyvonnia Dusky, MD 01/21/20 1156    Margarita Mail, PA-C 03/01/20 8850    Wyvonnia Dusky, MD 03/01/20 [id number]

## 2020-01-20 NOTE — Discharge Instructions (Addendum)

## 2020-01-20 NOTE — ED Notes (Signed)
Patient covered from head to toe with blankets; states headache decreased to 7/10; patient states still having some nausea. Abigail PA aware.

## 2020-01-20 NOTE — ED Triage Notes (Signed)
Headache today. Hx of chronic headaches.

## 2020-02-04 ENCOUNTER — Ambulatory Visit (INDEPENDENT_AMBULATORY_CARE_PROVIDER_SITE_OTHER): Payer: BC Managed Care – PPO | Admitting: Neurology

## 2020-02-04 ENCOUNTER — Other Ambulatory Visit: Payer: Self-pay

## 2020-02-04 ENCOUNTER — Encounter: Payer: Self-pay | Admitting: Neurology

## 2020-02-04 VITALS — BP 118/82 | HR 62 | Ht 63.0 in | Wt 107.0 lb

## 2020-02-04 DIAGNOSIS — G43709 Chronic migraine without aura, not intractable, without status migrainosus: Secondary | ICD-10-CM | POA: Diagnosis not present

## 2020-02-04 MED ORDER — PROPRANOLOL HCL ER 120 MG PO CP24: 120.0000 mg | ORAL_CAPSULE | Freq: Every day | ORAL | 3 refills | Status: AC

## 2020-02-04 MED ORDER — METOCLOPRAMIDE HCL 10 MG PO TABS: ORAL_TABLET | ORAL | 6 refills | Status: AC

## 2020-02-04 MED ORDER — RIZATRIPTAN BENZOATE 10 MG PO TBDP: 10.0000 mg | ORAL_TABLET | ORAL | 11 refills | Status: DC | PRN

## 2020-02-04 NOTE — Patient Instructions (Signed)
Increase propranolol and take at bedtime Continue the amitriptyline Acutely: Rizatriptan along with Reglan. (Metaclopramide). Can the Reglan up to 4x a day.  When pregnant stop the Maxalt  Metoclopramide tablets What is this medicine? METOCLOPRAMIDE (met oh kloe PRA mide) is used to treat the symptoms of gastroesophageal reflux disease (GERD) like heartburn. It is also used to treat people with slow emptying of the stomach and intestinal tract. This medicine may be used for other purposes; ask your health care provider or pharmacist if you have questions. COMMON BRAND NAME(S): Reglan What should I tell my health care provider before I take this medicine? They need to know if you have any of these conditions:  breast cancer  depression  diabetes  heart failure  high blood pressure  if you often drink alcohol  kidney disease  liver disease  Parkinson's disease or a movement disorder  pheochromocytoma  seizures  stomach obstruction, bleeding, or perforation  an unusual or allergic reaction to metoclopramide, procainamide, other medicines, foods, dyes, or preservatives  pregnant or trying to get pregnant  breast-feeding How should I use this medicine? Take this medicine by mouth with a glass of water. Follow the directions on the prescription label. Take this medicine on an empty stomach, about 30 minutes before eating. Take your doses at regular intervals. Do not take your medicine more often than directed. Do not stop taking except on the advice of your doctor or health care professional. A special MedGuide will be given to you by the pharmacist with each prescription and refill. Be sure to read this information carefully each time. Talk to your pediatrician regarding the use of this medicine in children. Special care may be needed. Overdosage: If you think you have taken too much of this medicine contact a poison control center or emergency room at once. NOTE: This  medicine is only for you. Do not share this medicine with others. What if I miss a dose? If you miss a dose, skip it. Take your next dose at the normal time. Do not take extra or 2 doses at the same time to make up for the missed dose. What may interact with this medicine?  alcohol  antihistamines for allergy, cough, and cold  atovaquone  atropine  bupropion  certain medicines for anxiety or sleep  certain medicines for bladder problems like oxybutynin, tolterodine  certain medicines for depression or psychotic disorders  certain medicines for Parkinson's disease  certain medicines for seizures like phenobarbital, primidone  certain medicines for stomach problems like dicyclomine, hyoscyamine  certain medicines for travel sickness like scopolamine  cyclosporine  digoxin  fosfomycin  general anesthetics like halothane, isoflurane, methoxyflurane, propofol  insulin and other medicines for diabetes  ipratropium  MAOIs like Carbex, Eldepryl, Marplan, Nardil, and Parnate  medicines that relax muscles for surgery  narcotic medicines for pain  paroxetine  phenothiazines like chlorpromazine, mesoridazine, prochlorperazine, thioridazine  posaconazole  quinidine  sirolimus  tacrolimus This list may not describe all possible interactions. Give your health care provider a list of all the medicines, herbs, non-prescription drugs, or dietary supplements you use. Also tell them if you smoke, drink alcohol, or use illegal drugs. Some items may interact with your medicine. What should I watch for while using this medicine? It may take a few weeks for your stomach condition to start to get better. However, do not take this medicine for longer than 12 weeks. The longer you take this medicine, and the more you take it, the greater  your chances are of developing serious side effects. If you are an elderly patient, a female patient, or you have diabetes, you may be at an  increased risk for side effects from this medicine. Contact your doctor immediately if you start having movements you cannot control such as lip smacking, rapid movements of the tongue, involuntary or uncontrollable movements of the eyes, head, arms and legs, or muscle twitches and spasms. Patients and their families should watch out for worsening depression or thoughts of suicide. Also watch out for any sudden or severe changes in feelings such as feeling anxious, agitated, panicky, irritable, hostile, aggressive, impulsive, severely restless, overly excited and hyperactive, or not being able to sleep. If this happens, especially at the beginning of treatment or after a change in dose, call your doctor. Do not treat yourself for high fever. Ask your doctor or health care professional for advice. You may get drowsy or dizzy. Do not drive, use machinery, or do anything that needs mental alertness until you know how this drug affects you. Do not stand or sit up quickly, especially if you are an older patient. This reduces the risk of dizzy or fainting spells. Alcohol can make you more drowsy and dizzy. Avoid alcoholic drinks. What side effects may I notice from receiving this medicine? Side effects that you should report to your doctor or health care professional as soon as possible:  allergic reactions like skin rash, itching or hives, swelling of the face, lips, or tongue  abnormal production of milk  breast enlargement in both males and females  change in sex drive or performance  depressed mood  menstrual changes  restlessness, pacing, inability to keep still  signs and symptoms of neuroleptic malignant syndrome (NMS) like confusion; fast, irregular heartbeat; high fever; increased sweating; uncontrollable head, mouth, neck, arm, or leg movements; stiff muscles  seizures  suicidal thoughts, mood changes  swelling of the ankles, feet, hands  tremor  uncontrollable movements of the  face, head, mouth, neck, arms, legs, or upper body  unusually weak or tired Side effects that usually do not require medical attention (report to your doctor or health care professional if they continue or are bothersome):  dizziness  drowsiness  headache  tiredness This list may not describe all possible side effects. Call your doctor for medical advice about side effects. You may report side effects to FDA at 1-800-FDA-1088. Where should I keep my medicine? Keep out of the reach of children. Store at room temperature between 20 and 25 degrees C (68 and 77 degrees F). Protect from light. Keep container tightly closed. Throw away any unused medicine after the expiration date. NOTE: This sheet is a summary. It may not cover all possible information. If you have questions about this medicine, talk to your doctor, pharmacist, or health care provider.  2020 Elsevier/Gold Standard (2018-12-28 10:16:27) Rizatriptan disintegrating tablets What is this medicine? RIZATRIPTAN (rye za TRIP tan) is used to treat migraines with or without aura. An aura is a strange feeling or visual disturbance that warns you of an attack. It is not used to prevent migraines. This medicine may be used for other purposes; ask your health care provider or pharmacist if you have questions. COMMON BRAND NAME(S): Maxalt-MLT What should I tell my health care provider before I take this medicine? They need to know if you have any of these conditions:  cigarette smoker  circulation problems in fingers and toes  diabetes  heart disease  high blood pressure  high cholesterol  history of irregular heartbeat  history of stroke  kidney disease  liver disease  stomach or intestine problems  an unusual or allergic reaction to rizatriptan, other medicines, foods, dyes, or preservatives  pregnant or trying to get pregnant  breast-feeding How should I use this medicine? Take this medicine by mouth. Follow the  directions on the prescription label. Leave the tablet in the sealed blister pack until you are ready to take it. With dry hands, open the blister and gently remove the tablet. If the tablet breaks or crumbles, throw it away and take a new tablet out of the blister pack. Place the tablet in the mouth and allow it to dissolve, and then swallow. Do not cut, crush, or chew this medicine. You do not need water to take this medicine. Do not take it more often than directed. Talk to your pediatrician regarding the use of this medicine in children. While this drug may be prescribed for children as young as 6 years for selected conditions, precautions do apply. Overdosage: If you think you have taken too much of this medicine contact a poison control center or emergency room at once. NOTE: This medicine is only for you. Do not share this medicine with others. What if I miss a dose? This does not apply. This medicine is not for regular use. What may interact with this medicine? Do not take this medicine with any of the following medicines:  certain medicines for migraine headache like almotriptan, eletriptan, frovatriptan, naratriptan, rizatriptan, sumatriptan, zolmitriptan  ergot alkaloids like dihydroergotamine, ergonovine, ergotamine, methylergonovine  MAOIs like Carbex, Eldepryl, Marplan, Nardil, and Parnate This medicine may also interact with the following medications:  certain medicines for depression, anxiety, or psychotic disorders  propranolol This list may not describe all possible interactions. Give your health care provider a list of all the medicines, herbs, non-prescription drugs, or dietary supplements you use. Also tell them if you smoke, drink alcohol, or use illegal drugs. Some items may interact with your medicine. What should I watch for while using this medicine? Visit your healthcare professional for regular checks on your progress. Tell your healthcare professional if your  symptoms do not start to get better or if they get worse. You may get drowsy or dizzy. Do not drive, use machinery, or do anything that needs mental alertness until you know how this medicine affects you. Do not stand up or sit up quickly, especially if you are an older patient. This reduces the risk of dizzy or fainting spells. Alcohol may interfere with the effect of this medicine. Your mouth may get dry. Chewing sugarless gum or sucking hard candy and drinking plenty of water may help. Contact your healthcare professional if the problem does not go away or is severe. If you take migraine medicines for 10 or more days a month, your migraines may get worse. Keep a diary of headache days and medicine use. Contact your healthcare professional if your migraine attacks occur more frequently. What side effects may I notice from receiving this medicine? Side effects that you should report to your doctor or health care professional as soon as possible:  allergic reactions like skin rash, itching or hives, swelling of the face, lips, or tongue  chest pain or chest tightness  signs and symptoms of a dangerous change in heartbeat or heart rhythm like chest pain; dizziness; fast, irregular heartbeat; palpitations; feeling faint or lightheaded; falls; breathing problems  signs and symptoms of a stroke like changes in  vision; confusion; trouble speaking or understanding; severe headaches; sudden numbness or weakness of the face, arm or leg; trouble walking; dizziness; loss of balance or coordination  signs and symptoms of serotonin syndrome like irritable; confusion; diarrhea; fast or irregular heartbeat; muscle twitching; stiff muscles; trouble walking; sweating; high fever; seizures; chills; vomiting Side effects that usually do not require medical attention (report to your doctor or health care professional if they continue or are bothersome):  diarrhea  dizziness  drowsiness  dry  mouth  headache  nausea, vomiting  pain, tingling, numbness in the hands or feet  stomach pain This list may not describe all possible side effects. Call your doctor for medical advice about side effects. You may report side effects to FDA at 1-800-FDA-1088. Where should I keep my medicine? Keep out of the reach of children. Store at room temperature between 15 and 30 degrees C (59 and 86 degrees F). Protect from light and moisture. Throw away any unused medicine after the expiration date. NOTE: This sheet is a summary. It may not cover all possible information. If you have questions about this medicine, talk to your doctor, pharmacist, or health care provider.  2020 Elsevier/Gold Standard (2017-11-18 14:58:08) Amitriptyline tablets What is this medicine? AMITRIPTYLINE (a mee TRIP ti leen) is used to treat depression. This medicine may be used for other purposes; ask your health care provider or pharmacist if you have questions. COMMON BRAND NAME(S): Elavil, Vanatrip What should I tell my health care provider before I take this medicine? They need to know if you have any of these conditions:  an alcohol problem  asthma, difficulty breathing  bipolar disorder or schizophrenia  difficulty passing urine, prostate trouble  glaucoma  heart disease or previous heart attack  liver disease  over active thyroid  seizures  thoughts or plans of suicide, a previous suicide attempt, or family history of suicide attempt  an unusual or allergic reaction to amitriptyline, other medicines, foods, dyes, or preservatives  pregnant or trying to get pregnant  breast-feeding How should I use this medicine? Take this medicine by mouth with a drink of water. Follow the directions on the prescription label. You can take the tablets with or without food. Take your medicine at regular intervals. Do not take it more often than directed. Do not stop taking this medicine suddenly except upon the  advice of your doctor. Stopping this medicine too quickly may cause serious side effects or your condition may worsen. A special MedGuide will be given to you by the pharmacist with each prescription and refill. Be sure to read this information carefully each time. Talk to your pediatrician regarding the use of this medicine in children. Special care may be needed. Overdosage: If you think you have taken too much of this medicine contact a poison control center or emergency room at once. NOTE: This medicine is only for you. Do not share this medicine with others. What if I miss a dose? If you miss a dose, take it as soon as you can. If it is almost time for your next dose, take only that dose. Do not take double or extra doses. What may interact with this medicine? Do not take this medicine with any of the following medications:  arsenic trioxide  certain medicines used to regulate abnormal heartbeat or to treat other heart conditions  cisapride  droperidol  halofantrine  linezolid  MAOIs like Carbex, Eldepryl, Marplan, Nardil, and Parnate  methylene blue  other medicines for mental depression  phenothiazines like perphenazine, thioridazine and chlorpromazine  pimozide  probucol  procarbazine  sparfloxacin  St. John's Wort This medicine may also interact with the following medications:  atropine and related drugs like hyoscyamine, scopolamine, tolterodine and others  barbiturate medicines for inducing sleep or treating seizures, like phenobarbital  cimetidine  disulfiram  ethchlorvynol  thyroid hormones such as levothyroxine  ziprasidone This list may not describe all possible interactions. Give your health care provider a list of all the medicines, herbs, non-prescription drugs, or dietary supplements you use. Also tell them if you smoke, drink alcohol, or use illegal drugs. Some items may interact with your medicine. What should I watch for while using this  medicine? Tell your doctor if your symptoms do not get better or if they get worse. Visit your doctor or health care professional for regular checks on your progress. Because it may take several weeks to see the full effects of this medicine, it is important to continue your treatment as prescribed by your doctor. Patients and their families should watch out for new or worsening thoughts of suicide or depression. Also watch out for sudden changes in feelings such as feeling anxious, agitated, panicky, irritable, hostile, aggressive, impulsive, severely restless, overly excited and hyperactive, or not being able to sleep. If this happens, especially at the beginning of treatment or after a change in dose, call your health care professional. Dennis Bast may get drowsy or dizzy. Do not drive, use machinery, or do anything that needs mental alertness until you know how this medicine affects you. Do not stand or sit up quickly, especially if you are an older patient. This reduces the risk of dizzy or fainting spells. Alcohol may interfere with the effect of this medicine. Avoid alcoholic drinks. Do not treat yourself for coughs, colds, or allergies without asking your doctor or health care professional for advice. Some ingredients can increase possible side effects. Your mouth may get dry. Chewing sugarless gum or sucking hard candy, and drinking plenty of water will help. Contact your doctor if the problem does not go away or is severe. This medicine may cause dry eyes and blurred vision. If you wear contact lenses you may feel some discomfort. Lubricating drops may help. See your eye doctor if the problem does not go away or is severe. This medicine can cause constipation. Try to have a bowel movement at least every 2 to 3 days. If you do not have a bowel movement for 3 days, call your doctor or health care professional. This medicine can make you more sensitive to the sun. Keep out of the sun. If you cannot avoid  being in the sun, wear protective clothing and use sunscreen. Do not use sun lamps or tanning beds/booths. What side effects may I notice from receiving this medicine? Side effects that you should report to your doctor or health care professional as soon as possible:  allergic reactions like skin rash, itching or hives, swelling of the face, lips, or tongue  anxious  breathing problems  changes in vision  confusion  elevated mood, decreased need for sleep, racing thoughts, impulsive behavior  eye pain  fast, irregular heartbeat  feeling faint or lightheaded, falls  feeling agitated, angry, or irritable  fever with increased sweating  hallucination, loss of contact with reality  seizures  stiff muscles  suicidal thoughts or other mood changes  tingling, pain, or numbness in the feet or hands  trouble passing urine or change in the amount of urine  trouble  sleeping  unusually weak or tired  vomiting  yellowing of the eyes or skin Side effects that usually do not require medical attention (report to your doctor or health care professional if they continue or are bothersome):  change in sex drive or performance  change in appetite or weight  constipation  dizziness  dry mouth  nausea  tired  tremors  upset stomach This list may not describe all possible side effects. Call your doctor for medical advice about side effects. You may report side effects to FDA at 1-800-FDA-1088. Where should I keep my medicine? Keep out of the reach of children. Store at room temperature between 20 and 25 degrees C (68 and 77 degrees F). Throw away any unused medicine after the expiration date. NOTE: This sheet is a summary. It may not cover all possible information. If you have questions about this medicine, talk to your doctor, pharmacist, or health care provider.  2020 Elsevier/Gold Standard (2018-04-28 13:04:32) Propranolol Extended-Release Capsules What is this  medicine? PROPRANOLOL (proe PRAN oh lole) is a beta blocker. It decreases the amount of work your heart has to do and helps your heart beat regularly. It treats high blood pressure and/or prevent chest pain (also called angina). This medicine may be used for other purposes; ask your health care provider or pharmacist if you have questions. COMMON BRAND NAME(S): Inderal LA, Inderal XL, InnoPran XL What should I tell my health care provider before I take this medicine? They need to know if you have any of these conditions:  circulation problems, or blood vessel disease  diabetes  history of heart attack or heart disease, vasospastic angina  kidney disease  liver disease  lung or breathing disease, like asthma or emphysema  pheochromocytoma  slow heart rate  thyroid disease  an unusual or allergic reaction to propranolol, other beta-blockers, medicines, foods, dyes, or preservatives  pregnant or trying to get pregnant  breast-feeding How should I use this medicine? Take this drug by mouth. Take it as directed on the prescription label at the same time every day. Do not cut, crush or chew this drug. Swallow the capsules whole. You can take it with or without food. If it upsets your stomach, take it with food. Keep taking it unless your health care provider tells you to stop. Talk to your health care provider about the use of this drug in children. Special care may be needed. Overdosage: If you think you have taken too much of this medicine contact a poison control center or emergency room at once. NOTE: This medicine is only for you. Do not share this medicine with others. What if I miss a dose? If you miss a dose, take it as soon as you can. If it is almost time for your next dose, take only that dose. Do not take double or extra doses. What may interact with this medicine? Do not take this medicine with any of the following medications:  feverfew  phenothiazines like  chlorpromazine, mesoridazine, prochlorperazine, thioridazine This medicine may also interact with the following medications:  aluminum hydroxide gel  antipyrine  antiviral medicines for HIV or AIDS  barbiturates like phenobarbital  certain medicines for blood pressure, heart disease, irregular heart beat  cimetidine  ciprofloxacin  diazepam  fluconazole  haloperidol  isoniazid  medicines for cholesterol like cholestyramine or colestipol  medicines for mental depression  medicines for migraine headache like almotriptan, eletriptan, frovatriptan, naratriptan, rizatriptan, sumatriptan, zolmitriptan  NSAIDs, medicines for pain and inflammation, like  ibuprofen or naproxen  phenytoin  rifampin  teniposide  theophylline  thyroid medicines  tolbutamide  warfarin  zileuton This list may not describe all possible interactions. Give your health care provider a list of all the medicines, herbs, non-prescription drugs, or dietary supplements you use. Also tell them if you smoke, drink alcohol, or use illegal drugs. Some items may interact with your medicine. What should I watch for while using this medicine? Visit your doctor or health care professional for regular check ups. Contact your doctor right away if your symptoms worsen. Check your blood pressure and pulse rate regularly. Ask your health care professional what your blood pressure and pulse rate should be, and when you should contact them. Do not stop taking this medicine suddenly. This could lead to serious heart-related effects. You may get drowsy or dizzy. Do not drive, use machinery, or do anything that needs mental alertness until you know how this drug affects you. Do not stand or sit up quickly, especially if you are an older patient. This reduces the risk of dizzy or fainting spells. Alcohol can make you more drowsy and dizzy. Avoid alcoholic drinks. This medicine may increase blood sugar. Ask your healthcare  provider if changes in diet or medicines are needed if you have diabetes. Do not treat yourself for coughs, colds, or pain while you are taking this medicine without asking your doctor or health care professional for advice. Some ingredients may increase your blood pressure. What side effects may I notice from receiving this medicine? Side effects that you should report to your doctor or health care professional as soon as possible:  allergic reactions like skin rash, itching or hives, swelling of the face, lips, or tongue  breathing problems  cold hands or feet  difficulty sleeping, nightmares  dry peeling skin  hallucinations  muscle cramps or weakness   signs and symptoms of high blood sugar such as being more thirsty or hungry or having to urinate more than normal. You may also feel very tired or have blurry vision.  slow heart rate  swelling of the legs and ankles  vomiting Side effects that usually do not require medical attention (report to your doctor or health care professional if they continue or are bothersome):  change in sex drive or performance  diarrhea  dry sore eyes  hair loss  nausea  weak or tired This list may not describe all possible side effects. Call your doctor for medical advice about side effects. You may report side effects to FDA at 1-800-FDA-1088. Where should I keep my medicine? Keep out of the reach of children and pets. Store at room temperature between 15 and 30 degrees C (59 and 86 degrees F). Protect from light and moisture. Keep the container tightly closed. Avoid exposure to extreme heat. Do not freeze. Throw away any unused drug after the expiration date. NOTE: This sheet is a summary. It may not cover all possible information. If you have questions about this medicine, talk to your doctor, pharmacist, or health care provider.  2020 Elsevier/Gold Standard (2018-12-14 16:23:26)

## 2020-02-04 NOTE — Progress Notes (Signed)
[facility]    Provider:  Dr Jaynee Eagles Requesting Provider: Suzy Bouchard, PA* Primary Care Provider:  Pcp, No  CC:  Migraines  HPI:  Kendra Graham is a 34 y.o. female here as requested by Suzy Bouchard, PA* for migraine. She has had them since 2012 since depo and stayed on it and it was too long. After that she would get them every once in a while, didn;t like light or noises. But in 2018 is when they started coming more frequent and symptoms started changing after she moved to [facility], she was having weakness on the left side, later in 2018 it "hit her" she blacked out, she lost consciousness. She was started on medications. She would have to go to the hospital and get IV medications. Last year they started worsening, would last 4-5 days, they would start on the right side, anything could trigger it, would start unilaterally, pounding/punding/throbbing, light and sound senitivity, she had to go to the ED and she was in the waiting area for so long. It's been awful. Severe. Lasts 24-72 hours or longer, nausea, movement makes it worse. She just got the propranolol in January not helping. Amitriptyline not helping. Maxalt may help, she has to take a second dose. At least 1/2 the month of migraines. She is trying to get pregnant. She declines botox. 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 No aura. No medication overuse. No other focal neurologic deficits, associated symptoms, inciting events or modifiable factors.  Reviewed notes, labs and imaging from outside physicians, which showed:  preg urine 01/20/2020: negative. Bmp unremrkable 07/05/2019.   CTA H/N 07/05/2019: reviewed CTA h/N report and images and agree as below CT HEAD FINDINGS: personally reviewed images and agree  Brain: No evidence of acute infarction, hemorrhage, hydrocephalus, extra-axial collection or mass lesion/mass effect.  Vascular: No hyperdense vessel or unexpected calcification.  Skull: Normal.  Negative for fracture or focal lesion.  Sinuses: Imaged portions are clear.  Orbits: No acute finding.  Review of the MIP images confirms the above findings  CTA NECK FINDINGS  Aortic arch: There is common origin of the innominate and left common carotid artery. Imaged portion shows no evidence of aneurysm or dissection. No significant stenosis of the major arch vessel origins.  Right carotid system: No evidence of dissection, stenosis (50% or greater) or occlusion.  Left carotid system: No evidence of dissection, stenosis (50% or greater) or occlusion.  Vertebral arteries: Codominant. No evidence of dissection, stenosis (50% or greater) or occlusion.  Skeleton: Negative  Other neck: Negative  Upper chest: Unremarkable  Review of the MIP images confirms the above findings  CTA HEAD FINDINGS  Anterior circulation: No significant stenosis, proximal occlusion, aneurysm, or vascular malformation.  Posterior circulation: No significant stenosis, proximal occlusion, aneurysm, or vascular malformation.  Venous sinuses: As permitted by contrast timing, patent.  Anatomic variants: No clinically of relevant anatomical variations.  Review of the MIP images confirms the above findings  IMPRESSION: 1. Normal brain. 2. Normal CTA of the neck.  MRI brain 07/05/2019:personally reviewed images and agree with following  FINDINGS: The examination is severely degraded by susceptibility artifacts from the patient's dental braces. Diffusion-weighted imaging is completely nondiagnostic. The FLAIR sequence is moderately degraded but the visualized portion of the brain is normal.  IMPRESSION: Severely compromised study due to susceptibility effects from orthodontic hardware. CT perfusion scan would be a possible alternative, if clinically indicated.   Review of Systems: Patient complains of symptoms per HPI as well as  the following symptoms: headache.  Pertinent negatives and positives per HPI. All others negative.   Social History   Socioeconomic History  . Marital status: Single    Spouse name: Not on file  . Number of children: Not on file  . Years of education: Not on file  . Highest education level: Not on file  Occupational History  . Not on file  Tobacco Use  . Smoking status: Never Smoker  . Smokeless tobacco: Never Used  Vaping Use  . Vaping Use: Never used  Substance and Sexual Activity  . Alcohol use: Yes    Comment: social  . Drug use: No  . Sexual activity: Yes    Birth control/protection: None  Other Topics Concern  . Not on file  Social History Narrative   Lives with her son   Right handed   Caffeine: varies, some days none, some days 4 cups or more   Social Determinants of Health   Financial Resource Strain:   . Difficulty of Paying Living Expenses: Not on file  Food Insecurity:   . Worried About Charity fundraiser in the Last Year: Not on file  . Ran Out of Food in the Last Year: Not on file  Transportation Needs:   . Lack of Transportation (Medical): Not on file  . Lack of Transportation (Non-Medical): Not on file  Physical Activity:   . Days of Exercise per Week: Not on file  . Minutes of Exercise per Session: Not on file  Stress:   . Feeling of Stress : Not on file  Social Connections:   . Frequency of Communication with Friends and Family: Not on file  . Frequency of Social Gatherings with Friends and Family: Not on file  . Attends Religious Services: Not on file  . Active Member of Clubs or Organizations: Not on file  . Attends Archivist Meetings: Not on file  . Marital Status: Not on file  Intimate Partner Violence:   . Fear of Current or Ex-Partner: Not on file  . Emotionally Abused: Not on file  . Physically Abused: Not on file  . Sexually Abused: Not on file    Family History  Problem Relation Age of Onset  . Diabetes Mother   . Hyperlipidemia Mother   .  Hypertension Mother   . Cancer Father   . Cancer Maternal Grandmother   . Migraines Sister     Past Medical History:  Diagnosis Date  . Arthritic-like pain   . Asthma   . Migraine   . Pancreatitis     Patient Active Problem List   Diagnosis Date Noted  . Chronic migraine without aura without status migrainosus, not intractable 02/05/2020    Past Surgical History:  Procedure Laterality Date  . NO PAST SURGERIES      Current Outpatient Medications  Medication Sig Dispense Refill  . amitriptyline (ELAVIL) 100 MG tablet Take 100 mg by mouth at bedtime.    Marland Kitchen ibuprofen (ADVIL,MOTRIN) 800 MG tablet Take 1 tablet (800 mg total) by mouth every 8 (eight) hours as needed. 21 tablet 0  . metoCLOPramide (REGLAN) 10 MG tablet Take up to 4x a day for migraine. May take with Rizatriptan/maxalt. 15 tablet 6  . propranolol ER (INDERAL LA) 120 MG 24 hr capsule Take 1 capsule (120 mg total) by mouth daily. 90 capsule 3  . rizatriptan (MAXALT-MLT) 10 MG disintegrating tablet Take 1 tablet (10 mg total) by mouth as needed for migraine. May repeat in  2 hours if needed 9 tablet 11   No current facility-administered medications for this visit.    Allergies as of 02/04/2020 - Review Complete 02/04/2020  Allergen Reaction Noted  . Decadron [dexamethasone]  07/05/2019  . Vinyl ether Hives and Rash 12/23/2010    Vitals: BP 118/82 (BP Location: Right Arm, Patient Position: Sitting)   Pulse 62   Ht 5\' 3"  (1.6 m)   Wt 107 lb (48.5 kg)   LMP 01/26/2020 (Exact Date)   BMI 18.95 kg/m  Last Weight:  Wt Readings from Last 1 Encounters:  02/04/20 107 lb (48.5 kg)   Last Height:   Ht Readings from Last 1 Encounters:  02/04/20 5\' 3"  (1.6 m)     Physical exam: Exam: Gen: NAD, conversant, well nourised, well groomed                     CV: RRR, no MRG. No Carotid Bruits. No peripheral edema, warm, nontender Eyes: Conjunctivae clear without exudates or hemorrhage  Neuro: Detailed Neurologic  Exam  Speech:    Speech is normal; fluent and spontaneous with normal comprehension.  Cognition:    The patient is oriented to person, place, and time;     recent and remote memory intact;     language fluent;     normal attention, concentration,     fund of knowledge Cranial Nerves:    The pupils are equal, round, and reactive to light. The fundi are normal and spontaneous venous pulsations are present. Visual fields are full to finger confrontation. Extraocular movements are intact. Trigeminal sensation is intact and the muscles of mastication are normal. The face is symmetric. The palate elevates in the midline. Hearing intact. Voice is normal. Shoulder shrug is normal. The tongue has normal motion without fasciculations.   Coordination:    No dysmetria or ataxia  Gait:    Normal native gait  Motor Observation:    No asymmetry, no atrophy, and no involuntary movements noted. Tone:    Normal muscle tone.    Posture:    Posture is normal. normal erect    Strength:    Strength is V/V in the upper and lower limbs.      Sensation: intact to LT     Reflex Exam:  DTR's:    Deep tendon reflexes in the upper and lower extremities are normal bilaterally.   Toes:    The toes are downgoing bilaterally.   Clonus:    Clonus is absent.    Assessment/Plan: This is a young patient with chronic migraines.  She has failed multiple medications however options are limited because she is trying to get pregnant.  We spent an extended amount of time talking about migraines in pregnancy, 80% of women do have reduced frequency of migraines during pregnant, however all medications have side effects and her risk, there are no medications that are known to be completely risk-free.  We do have some medications that we think are compatible with pregnancy but again nothing is without risk.   Currently she is on propranolol and she states that she is having no side effects except for maybe some fatigue  at this point we can increase it and give her extended release in the evenings to help with the side effects.  I did warn her to watch for lightheadedness, chest pain, increased fatigue or any other significant symptoms and call us or proceed to 911.  We did discuss propranolol in pregnancy, she should talk  to her OB/GYN and ensure that they feel this is appropriate for her if she is trying to get pregnant, there is a risk of birth defects with propranolol however there is not enough data to specify with much certainty, watch for decreased uterine growth and amniotic fluid, in the third trimester watch for beta-blocking effects on the baby such as heart rate.  Also need to reevaluate prior to breast-feeding.  Increase propranolol and take at bedtime (see above discussion) Continue the amitriptyline: Amitriptyline is thought to be compatible with pregnancy but again the same applies as the propranolol, there have been some studies about limb deformities, again she should discuss with OB/GYN.  Also need to reevaluate prior to breast-feeding. Acutely: Rizatriptan along with Reglan. (Metaclopramide). Can the Reglan up to 4x a day.  When pregnant stop the Maxalt as this medication is contraindicated in pregnancy Metoclopramide: Thought to be compatible with pregnancy however every medication may have risks, again we discussed this in detail.  Can also cause tardive dyskinesias that usually after long-term regular use, so use only as needed. We reviewed her imaging at appointment as well.  I also spent time reviewing all the images: CT of the head, CTA head and neck, MRI of the brain. - she declines botox. CGRP medications contraindicated if she is trying to get pregnant  No orders of the defined types were placed in this encounter.  Meds ordered this encounter  Medications  . metoCLOPramide (REGLAN) 10 MG tablet    Sig: Take up to 4x a day for migraine. May take with Rizatriptan/maxalt.    Dispense:  15  tablet    Refill:  6  . propranolol ER (INDERAL LA) 120 MG 24 hr capsule    Sig: Take 1 capsule (120 mg total) by mouth daily.    Dispense:  90 capsule    Refill:  3  . rizatriptan (MAXALT-MLT) 10 MG disintegrating tablet    Sig: Take 1 tablet (10 mg total) by mouth as needed for migraine. May repeat in 2 hours if needed    Dispense:  9 tablet    Refill:  11    Cc: Suzy Bouchard, PA*,  Pcp, No  Sarina Ill, MD  [facility] [address] [place], [place] [address]  Phone [phone] Fax [phone]  I spent over 80 minutes of face-to-face and non-face-to-face time with patient on the  1. Chronic migraine without aura without status migrainosus, not intractable    diagnosis.  This included previsit chart review, lab review, study review, order entry, electronic health record documentation, patient education on the different diagnostic and therapeutic options, counseling and coordination of care, risks and benefits of management, compliance, or risk factor reduction

## 2020-02-05 ENCOUNTER — Encounter: Payer: Self-pay | Admitting: Neurology

## 2020-02-05 DIAGNOSIS — G43709 Chronic migraine without aura, not intractable, without status migrainosus: Secondary | ICD-10-CM | POA: Insufficient documentation

## 2020-04-30 ENCOUNTER — Other Ambulatory Visit: Payer: Self-pay

## 2020-04-30 ENCOUNTER — Encounter (HOSPITAL_BASED_OUTPATIENT_CLINIC_OR_DEPARTMENT_OTHER): Payer: Self-pay | Admitting: Emergency Medicine

## 2020-04-30 ENCOUNTER — Emergency Department (HOSPITAL_BASED_OUTPATIENT_CLINIC_OR_DEPARTMENT_OTHER)
Admission: EM | Admit: 2020-04-30 | Discharge: 2020-04-30 | Disposition: A | Discharge status: Home / Self Care | Payer: Medicaid Other | Attending: Emergency Medicine | Admitting: Emergency Medicine

## 2020-04-30 DIAGNOSIS — J45909 Unspecified asthma, uncomplicated: Secondary | ICD-10-CM | POA: Insufficient documentation

## 2020-04-30 DIAGNOSIS — R519 Headache, unspecified: Secondary | ICD-10-CM | POA: Insufficient documentation

## 2020-04-30 DIAGNOSIS — R110 Nausea: Secondary | ICD-10-CM | POA: Insufficient documentation

## 2020-04-30 DIAGNOSIS — Z8669 Personal history of other diseases of the nervous system and sense organs: Secondary | ICD-10-CM | POA: Diagnosis not present

## 2020-04-30 DIAGNOSIS — H5319 Other subjective visual disturbances: Secondary | ICD-10-CM | POA: Insufficient documentation

## 2020-04-30 MED ORDER — ONDANSETRON HCL 4 MG/2ML IJ SOLN
4.0000 mg | Freq: Once | INTRAMUSCULAR | Status: AC
Start: 2020-04-30 — End: 2020-04-30
  Administered 2020-04-30: 12:00:00 4 mg via INTRAVENOUS
  Filled 2020-04-30: qty 2

## 2020-04-30 MED ORDER — DIPHENHYDRAMINE HCL 50 MG/ML IJ SOLN
25.0000 mg | Freq: Once | INTRAMUSCULAR | Status: AC
  Administered 2020-04-30: 12:00:00 25 mg via INTRAVENOUS
  Filled 2020-04-30: qty 1

## 2020-04-30 MED ORDER — SODIUM CHLORIDE 0.9 % IV BOLUS
500.0000 mL | Freq: Once | INTRAVENOUS | Status: AC
  Administered 2020-04-30: 500 mL via INTRAVENOUS

## 2020-04-30 MED ORDER — KETOROLAC TROMETHAMINE 30 MG/ML IJ SOLN
15.0000 mg | Freq: Once | INTRAMUSCULAR | Status: AC
  Administered 2020-04-30: 15 mg via INTRAVENOUS
  Filled 2020-04-30: qty 1

## 2020-04-30 MED ORDER — PROCHLORPERAZINE EDISYLATE 10 MG/2ML IJ SOLN
10.0000 mg | Freq: Once | INTRAMUSCULAR | Status: AC
  Administered 2020-04-30: 10 mg via INTRAVENOUS
  Filled 2020-04-30: qty 2

## 2020-04-30 NOTE — Discharge Instructions (Addendum)
Seen here for a headache.  Exam looks reassuring.  Recommend continue with your home medications.  May take over-the-counter pain medications like ibuprofen or Tylenol every 6 hours as needed please follow dosage and on the back of bottle.  Recommend follow-up with your PCP or neurologist for further evaluation.  Come back to the emergency department if you develop chest pain, shortness of breath, severe abdominal pain, uncontrolled nausea, vomiting, diarrhea.

## 2020-04-30 NOTE — ED Provider Notes (Signed)
[place] EMERGENCY DEPARTMENT Provider Note   CSN: [id number] Arrival date & time: 04/30/20  1036     History Chief Complaint  Patient presents with  . Headache    Kendra Graham is a 34 y.o. female.  HPI    Patient with significant medical history of asthma, migraines presents to the emergency department  with chief complaint of worsening headaches. Patient states headache started  yesterday around dinnertime after she got food. She states she thinks something in the food triggered her migraine. She endorses bilateral frontal headaches which she describes as a constant aching sensation, she states she sees some floaters in her eyes, has associated photophobia and increasing sensitivity to loud noises. she denies change in vision, paresthesias or weakness the upper or lower extremities. She has occasional nausea but has not actually vomited. She states this feels similar to past migraines. She denies recent head traumas, is not on anticoags. She was seen at her neurologist about 2 months ago where she had normal MRI and head CT, currently on propranolol, amitriptyline and Reglan without any relief. Patient denies fevers, chills, shortness of breath, chest pain, abdominal pain, nausea, vomiting, diarrhea, pedal edema.  Past Medical History:  Diagnosis Date  . Arthritic-like pain   . Asthma   . Migraine   . Pancreatitis     Patient Active Problem List   Diagnosis Date Noted  . Chronic migraine without aura without status migrainosus, not intractable 02/05/2020    Past Surgical History:  Procedure Laterality Date  . NO PAST SURGERIES       OB History    Gravida  1   Para      Term      Preterm      AB      Living        SAB      IAB      Ectopic      Multiple      Live Births              Family History  Problem Relation Age of Onset  . Diabetes Mother   . Hyperlipidemia Mother   . Hypertension Mother   . Cancer Father   . Cancer  Maternal Grandmother   . Migraines Sister     Social History   Tobacco Use  . Smoking status: Never Smoker  . Smokeless tobacco: Never Used  Vaping Use  . Vaping Use: Never used  Substance Use Topics  . Alcohol use: Yes    Comment: social  . Drug use: No    Home Medications Prior to Admission medications   Medication Sig Start Date End Date Taking? Authorizing Provider  amitriptyline (ELAVIL) 100 MG tablet Take 100 mg by mouth at bedtime. 05/28/19   [provider]  ibuprofen (ADVIL,MOTRIN) 800 MG tablet Take 1 tablet (800 mg total) by mouth every 8 (eight) hours as needed. 05/27/18   Petrucelli, Samantha R, PA-C  metoCLOPramide (REGLAN) 10 MG tablet Take up to 4x a day for migraine. May take with Rizatriptan/maxalt. 02/04/20   Melvenia Beam, MD  propranolol ER (INDERAL LA) 120 MG 24 hr capsule Take 1 capsule (120 mg total) by mouth daily. 02/04/20   Melvenia Beam, MD  rizatriptan (MAXALT-MLT) 10 MG disintegrating tablet Take 1 tablet (10 mg total) by mouth as needed for migraine. May repeat in 2 hours if needed 02/04/20   Melvenia Beam, MD    Allergies    Decadron [  dexamethasone] and Vinyl ether  Review of Systems   Review of Systems  Constitutional: Negative for chills and fever.  HENT: Negative for congestion.   Eyes: Positive for photophobia. Negative for visual disturbance.  Respiratory: Negative for shortness of breath.   Cardiovascular: Negative for chest pain.  Gastrointestinal: Negative for abdominal pain.  Genitourinary: Negative for enuresis.  Musculoskeletal: Negative for back pain.  Skin: Negative for rash.  Neurological: Positive for headaches. Negative for dizziness.  Hematological: Does not bruise/bleed easily.    Physical Exam Updated Vital Signs BP 113/80 (BP Location: Right Arm)   Pulse 62   Temp 98.2 F (36.8 C) (Oral)   Resp 16   Ht 5\' 3"  (1.6 m)   Wt 53.1 kg   LMP 04/18/2020   SpO2 100%   BMI 20.73 kg/m   Physical  Exam Vitals and nursing note reviewed.  Constitutional:      General: She is in acute distress.     Appearance: Normal appearance. She is not ill-appearing or diaphoretic.  HENT:     Head: Normocephalic and atraumatic.     Nose: No congestion or rhinorrhea.     Mouth/Throat:     Mouth: Mucous membranes are moist.     Pharynx: Oropharynx is clear. No oropharyngeal exudate or posterior oropharyngeal erythema.     Comments: Oropharynx is visualized no dental cavities noted. Eyes:     General: No visual field deficit or scleral icterus.    Extraocular Movements: Extraocular movements intact.     Conjunctiva/sclera: Conjunctivae normal.     Pupils: Pupils are equal, round, and reactive to light.  Cardiovascular:     Rate and Rhythm: Normal rate and regular rhythm.     Pulses: Normal pulses.     Heart sounds: No murmur heard. No friction rub. No gallop.   Pulmonary:     Effort: Pulmonary effort is normal. No respiratory distress.     Breath sounds: No wheezing, rhonchi or rales.  Abdominal:     Palpations: Abdomen is soft.     Tenderness: There is no abdominal tenderness.  Musculoskeletal:        General: No swelling or tenderness.     Cervical back: No rigidity.     Comments: Patient is moving all 4 extremities out difficulty.  Skin:    General: Skin is warm and dry.  Neurological:     General: No focal deficit present.     Mental Status: She is alert.     GCS: GCS eye subscore is 4. GCS verbal subscore is 5. GCS motor subscore is 6.     Cranial Nerves: Cranial nerves are intact. No cranial nerve deficit or facial asymmetry.     Sensory: Sensation is intact. No sensory deficit.     Motor: Motor function is intact. No weakness or pronator drift.     Coordination: Coordination is intact. Finger-Nose-Finger Test normal.  Psychiatric:        Mood and Affect: Mood normal.     ED Results / Procedures / Treatments   Labs (all labs ordered are listed, but only abnormal results are  displayed) Labs Reviewed - No data to display  EKG EKG Interpretation  Date/Time:  Sunday April 30 2020 11:55:05 EST Ventricular Rate:  57 PR Interval:    QRS Duration: 84 QT Interval:  411 QTC Calculation: 401 R Axis:   80 Text Interpretation: Sinus rhythm nonspecific st,ts No old tracing to compare Confirmed by Aletta Edouard [phone]) on 04/30/2020 11:58:33 AM  Radiology No results found.  Procedures Procedures (including critical care time)  Medications Ordered in ED Medications  prochlorperazine (COMPAZINE) injection 10 mg (10 mg Intravenous Given 04/30/20 1224)  ketorolac (TORADOL) 30 MG/ML injection 15 mg (15 mg Intravenous Given 04/30/20 1220)  sodium chloride 0.9 % bolus 500 mL (0 mLs Intravenous Stopped 04/30/20 1330)  ondansetron (ZOFRAN) injection 4 mg (4 mg Intravenous Given 04/30/20 1221)  diphenhydrAMINE (BENADRYL) injection 25 mg (25 mg Intravenous Given 04/30/20 1219)    ED Course  I have reviewed the triage vital signs and the nursing notes.  Pertinent labs & imaging results that were available during my care of the patient were reviewed by me and considered in my medical decision making (see chart for details).    MDM Rules/Calculators/A&P                          Patient presents with migraines. She is alert, appeared to be in acute distress, vital signs reassuring. Due to  benign physical exam further lab and imaging not warranted. Will start patient on migraine cocktail will order EKG to assess QT as she is on several QT prolongation medications.  Patient was reassessed after providing her with migraine cocktail states she starting to feel better and that the pain is more manageable.  EKG with sinus rhythm at signs of ischemia no ST elevation depression noted, no QT prolongation.  low suspicion for CVA or intracranial head bleed as patient denies change in vision, paresthesias or weakness to upper lower extremities, no neuro deficits noted on  exam, will defer CT imaging.  Low suspicion for systemic infection as patient is nontoxic-appearing, vital signs reassuring, no obvious source infection noted on exam.  Low suspicion for dental abnormality causing migraines as oropharynx was visualized there is no dental cavities noted on my exam.  Patient had a negative meningeal sign.  Suspect patient suffering from a migraine, will recommend continuing with her medications and following up with her neurologist for further evaluation.  Vital signs have remained stable, no indication for hospital admission.   Patient given at home care as well strict return precautions.  Patient verbalized that they understood agreed to said plan.      Final Clinical Impression(s) / ED Diagnoses Final diagnoses:  Bad headache    Rx / DC Orders ED Discharge Orders    None       Marcello Fennel, PA-C 04/30/20 1349    Hayden Rasmussen, MD 05/01/20 1421

## 2020-04-30 NOTE — ED Triage Notes (Signed)
Headache since last night, hx of migraines. She took her normal meds without relief.

## 2020-04-30 NOTE — ED Notes (Signed)
Resting quietly, cont to c/o HA , rates pain a 9 on 0-10 scale, states the lights are still bothering here, lying with blanket over eyes.

## 2020-04-30 NOTE — ED Notes (Signed)
ED Provider at bedside. 

## 2020-06-16 ENCOUNTER — Other Ambulatory Visit: Payer: Medicaid Other

## 2020-06-16 ENCOUNTER — Ambulatory Visit: Payer: Medicaid Other | Admitting: Internal Medicine

## 2020-06-16 DIAGNOSIS — Z20822 Contact with and (suspected) exposure to covid-19: Secondary | ICD-10-CM

## 2020-06-18 LAB — SARS-COV-2, NAA 2 DAY TAT

## 2020-06-18 LAB — NOVEL CORONAVIRUS, NAA: SARS-CoV-2, NAA: DETECTED — AB

## 2020-06-19 ENCOUNTER — Telehealth: Payer: Self-pay | Admitting: *Deleted

## 2020-06-19 ENCOUNTER — Other Ambulatory Visit: Payer: Self-pay | Admitting: Physician Assistant

## 2020-06-19 DIAGNOSIS — U071 COVID-19: Secondary | ICD-10-CM

## 2020-06-19 MED ORDER — MOLNUPIRAVIR EUA 200MG CAPSULE: 4.0000 | ORAL_CAPSULE | Freq: Two times a day (BID) | ORAL | 0 refills | Status: AC

## 2020-06-19 NOTE — Telephone Encounter (Signed)
Called to discuss with patient about COVID-19 symptoms and the use of one of the available treatments for those with mild to moderate Covid symptoms and at a high risk of hospitalization.  Pt appears to qualify for outpatient treatment due to co-morbid conditions and/or a member of an at-risk group in accordance with the FDA Emergency Use Authorization.    Symptom onset: 06/16/20 Today is day 4 Vaccinated: no Booster?  Immunocompromised? no Qualifiers: asthma, african american Body aches, cough, nausea, ST. Will take a call from provider.  Tarry Kos

## 2020-06-19 NOTE — Progress Notes (Signed)
Outpatient Oral COVID Treatment Note  I connected with Stephonie Graham on 06/19/2020/12:36 PM by telephone and verified that I am speaking with the correct person using two identifiers.  I discussed the limitations, risks, security, and privacy concerns of performing an evaluation and management service by telephone and the availability of in person appointments. I also discussed with the patient that there may be a patient responsible charge related to this service. The patient expressed understanding and agreed to proceed.  Patient location: home Provider location: office  Diagnosis: COVID-19 infection  Purpose of visit: Discussion of potential use of Molnupiravir or Paxlovid, a new treatment for mild to moderate COVID-19 viral infection in non-hospitalized patients.   Subjective: Patient is a 35 y.o. female who has been diagnosed with COVID 19 viral infection.  Their symptoms began on 1/28 with body aches, chills, HA, fever. She is unvaccinated.     Past Medical History:  Diagnosis Date  . Arthritic-like pain   . Asthma   . Migraine   . Pancreatitis     Allergies  Allergen Reactions  . Decadron [Dexamethasone]   . Vinyl Ether Hives and Rash     Current Outpatient Medications:  .  amitriptyline (ELAVIL) 100 MG tablet, Take 100 mg by mouth at bedtime., Disp: , Rfl:  .  ibuprofen (ADVIL,MOTRIN) 800 MG tablet, Take 1 tablet (800 mg total) by mouth every 8 (eight) hours as needed., Disp: 21 tablet, Rfl: 0 .  metoCLOPramide (REGLAN) 10 MG tablet, Take up to 4x a day for migraine. May take with Rizatriptan/maxalt., Disp: 15 tablet, Rfl: 6 .  propranolol ER (INDERAL LA) 120 MG 24 hr capsule, Take 1 capsule (120 mg total) by mouth daily., Disp: 90 capsule, Rfl: 3 .  rizatriptan (MAXALT-MLT) 10 MG disintegrating tablet, Take 1 tablet (10 mg total) by mouth as needed for migraine. May repeat in 2 hours if needed, Disp: 9 tablet, Rfl: 11  Objective: Patient sounds stable on phone.  They are in  no apparent distress.  Breathing is non labored.  Mood and behavior are normal.  Laboratory Data:  Recent Results (from the past 2160 hour(s))  Novel Coronavirus, NAA ([facility])     Status: Abnormal   Collection Time: 06/16/20  4:20 PM   Specimen: Nasopharyngeal(NP) swabs in vial transport medium   Nasopharynge  Result Value Ref Range   SARS-CoV-2, NAA Detected (A) Not Detected    Comment: Patients who have a positive COVID-19 test result may now have treatment options. Treatment options are available for patients with mild to moderate symptoms and for hospitalized patients. Visit our website at [email or web address] for resources and information. This nucleic acid amplification test was developed and its performance characteristics determined by [employer]. Nucleic acid amplification tests include RT-PCR and TMA. This test has not been FDA cleared or approved. This test has been authorized by FDA under an Emergency Use Authorization (EUA). This test is only authorized for the duration of time the declaration that circumstances exist justifying the authorization of the emergency use of in vitro diagnostic tests for detection of SARS-CoV-2 virus and/or diagnosis of COVID-19 infection under section 564(b)(1) of the Act, 21 U.S.C. 161WRU-0(A) (1), unless the authorization is terminated or revoked sooner. When diagnostic testing is negativ e, the possibility of a false negative result should be considered in the context of a patient's recent exposures and the presence of clinical signs and symptoms consistent with COVID-19. An individual without symptoms of COVID-19 and who is not shedding SARS-CoV-2 virus would  expect to have a negative (not detected) result in this assay.   SARS-COV-2, NAA 2 DAY TAT     Status: None   Collection Time: 06/16/20  4:20 PM   Nasopharynge  Result Value Ref Range   SARS-CoV-2, NAA 2 DAY TAT Performed      Assessment: 35 y.o. female  with mild/moderate COVID 19 viral infection diagnosed on 1/28 at high risk for progression to severe COVID 19.  Plan:  This patient is a 35 y.o. female that meets the following criteria for Emergency Use Authorization of: Molnupiravir  1. Age >18 yr 2. SARS-COV-2 positive test 3. Symptom onset < 5 days 4. Mild-to-moderate COVID disease with high risk for severe progression to hospitalization or death   I have spoken and communicated the following to the patient or parent/caregiver regarding: 1. Molnupiravir is an unapproved drug that is authorized for use under an [employer] production planner.  2. There are no adequate, approved, available products for the treatment of COVID-19 in adults who have mild-to-moderate COVID-19 and are at high risk for progressing to severe COVID-19, including hospitalization or death. 3. Other therapeutics are currently authorized. For additional information on all products authorized for treatment or prevention of COVID-19, please see [facility].  4. There are benefits and risks of taking this treatment as outlined in the "Fact Sheet for Patients and Caregivers."  5. "Fact Sheet for Patients and Caregivers" was reviewed with patient. A hard copy will be provided to patient from pharmacy prior to the patient receiving treatment. 6. Patients should continue to self-isolate and use infection control measures (e.g., wear mask, isolate, social distance, avoid sharing personal items, clean and disinfect "high touch" surfaces, and frequent handwashing) according to CDC guidelines.  7. The patient or parent/caregiver has the option to accept or refuse treatment. 8. [place] has established a pregnancy surveillance program. 9. Females of childbearing potential should use a reliable method of contraception correctly and consistently, as applicable, for the  duration of treatment and for 4 days after the last dose of Molnupiravir. 38. Males of reproductive potential who are sexually active with females of childbearing potential should use a reliable method of contraception correctly and consistently during treatment and for at least 3 months after the last dose. 11. Pregnancy status and risk was assessed. Patient verbalized understanding of precautions.   After reviewing above information with the patient, the patient agrees to receive molnupiravir.  Follow up instructions:    . Take prescription BID x 5 days as directed . Reach out to pharmacist for counseling on medication if desired . For concerns regarding further COVID symptoms please follow up with your PCP or urgent care . For urgent or life-threatening issues, seek care at your local emergency department  The patient was provided an opportunity to ask questions, and all were answered. The patient agreed with the plan and demonstrated an understanding of the instructions.   Script sent to [facility] and opted to pick up RX.  The patient was advised to call their PCP or seek an in-person evaluation if the symptoms worsen or if the condition fails to improve as anticipated.   I provided 10 minutes of non face-to-face telephone visit time during this encounter, and > 50% was spent counseling as documented under my assessment & plan.  Angelena Form, PA-C 06/19/2020 /12:36 PM

## 2020-06-21 ENCOUNTER — Telehealth: Payer: Medicaid Other | Admitting: Family

## 2020-06-21 DIAGNOSIS — U071 COVID-19: Secondary | ICD-10-CM

## 2020-06-21 MED ORDER — BENZONATATE 100 MG PO CAPS: 100.0000 mg | ORAL_CAPSULE | Freq: Three times a day (TID) | ORAL | 0 refills | Status: AC | PRN

## 2020-06-21 MED ORDER — FLUTICASONE PROPIONATE 50 MCG/ACT NA SUSP
2.0000 | Freq: Every day | NASAL | 6 refills | Status: AC
Start: 2020-06-21 — End: ?

## 2020-06-21 MED ORDER — ALBUTEROL SULFATE HFA 108 (90 BASE) MCG/ACT IN AERS: 2.0000 | INHALATION_SPRAY | Freq: Four times a day (QID) | RESPIRATORY_TRACT | 0 refills | Status: AC | PRN

## 2020-06-21 NOTE — Progress Notes (Signed)
E-Visit for Corona Virus Screening  We are sorry you are not feeling well. We are here to help!  You have tested positive for COVID-19, meaning that you were infected with the novel coronavirus and could give the virus to others.  It is vitally important that you stay home so you do not spread it to others.      Please continue isolation at home, for at least 10 days since the start of your symptoms and until you have had 24 hours with no fever (without taking a fever reducer) and with improving of symptoms.  If you have no symptoms but tested positive (or all symptoms resolve after 5 days and you have no fever) you can leave your house but continue to wear a mask around others for an additional 5 days. If you have a fever,continue to stay home until you have had 24 hours of no fever. Most cases improve 5-10 days from onset but we have seen a small number of patients who have gotten worse after the 10 days.  Please be sure to watch for worsening symptoms and remain taking the proper precautions.   Go to the nearest hospital ED for assessment if fever/cough/breathlessness are severe or illness seems like a threat to life.    The following symptoms may appear 2-14 days after exposure: . Fever . Cough . Shortness of breath or difficulty breathing . Chills . Repeated shaking with chills . Muscle pain . Headache . Sore throat . New loss of taste or smell . Fatigue . Congestion or runny nose . Nausea or vomiting . Diarrhea  You have been enrolled in [place] for COVID-19. Daily you will receive a questionnaire within the [facility] website. Our COVID-19 response team will be monitoring your responses daily.  You can use medication such as A prescription cough medication called Tessalon Perles 100 mg. You may take 1-2 capsules every 8 hours as needed for cough, A prescription inhaler called Albuterol MDI 90 mcg /actuation 2 puffs every 4 hours as needed for shortness of breath,  wheezing, cough and A prescription for Fluticasone nasal spray 2 sprays in each nostril one time per day  We have asked the monoclonal antibody team contact you to discuss the infusion with you and potentially set this up. You should hear from them in the next 48 hours.  There is a Producer, television/film/video, unsure if you will qualify. I have placed the referral and they will reach out to you if you do.    You may also take acetaminophen (Tylenol) as needed for fever.  HOME CARE: . Only take medications as instructed by your medical team. . Drink plenty of fluids and get plenty of rest. . A steam or ultrasonic humidifier can help if you have congestion.   GET HELP RIGHT AWAY IF YOU HAVE EMERGENCY WARNING SIGNS.  Call 911 or proceed to your closest emergency facility if: . You develop worsening high fever. . Trouble breathing . Bluish lips or face . Persistent pain or pressure in the chest . New confusion . Inability to wake or stay awake . You cough up blood. . Your symptoms become more severe . Inability to hold down food or fluids  This list is not all possible symptoms. Contact your medical provider for any symptoms that are severe or concerning to you.    Your e-visit answers were reviewed by a board certified advanced clinical practitioner to complete your personal care plan.  Depending on the condition,  your plan could have included both over the counter or prescription medications.  If there is a problem please reply once you have received a response from your provider.  Your safety is important to [place].  If you have drug allergies check your prescription carefully.    You can use MyChart to ask questions about today's visit, request a non-urgent call back, or ask for a work or school excuse for 24 hours related to this e-Visit. If it has been greater than 24 hours you will need to follow up with your provider, or enter a new e-Visit to address those concerns. You will get an e-mail in  the next two days asking about your experience.  I hope that your e-visit has been valuable and will speed your recovery. Thank you for using e-visits.   Approximately 5 minutes was spent documenting and reviewing patient's chart.

## 2020-06-22 MED ORDER — ONDANSETRON 8 MG PO TBDP: 8.0000 mg | ORAL_TABLET | Freq: Three times a day (TID) | ORAL | 0 refills | Status: AC | PRN

## 2020-06-22 NOTE — Addendum Note (Signed)
Addended by: Mercer Pod on: 06/22/2020 08:20 AM   Modules accepted: Orders

## 2020-08-01 ENCOUNTER — Telehealth: Payer: BC Managed Care – PPO | Admitting: Family Medicine

## 2020-08-01 NOTE — Progress Notes (Deleted)
PATIENT: Kendra Graham DOB: 08-21-1985  REASON FOR VISIT: follow up HISTORY FROM: patient  Virtual Visit via Telephone Note  I connected with Kendra Graham on 08/01/20 at  9:15 AM EDT by telephone and verified that I am speaking with the correct person using two identifiers.   I discussed the limitations, risks, security and privacy concerns of performing an evaluation and management service by telephone and the availability of in person appointments. I also discussed with the patient that there may be a patient responsible charge related to this service. The patient expressed understanding and agreed to proceed.   History of Present Illness:  08/01/20 ALL:  Kendra Graham is a 35 y.o. female here today for follow up for migraines. She was last seen by Dr Jaynee Eagles in 01/2020. Propranolol LA was increased 120mg  daily. She was advised to continue amitriptyline 100mg  prescribed by PCP. Rizatriptan and metoclopramide were advised for abortive therapy. She was advised to follow up closely with OB for desired pregnancy.    02/04/2020 AA:  HPI:  Kendra Graham is a 35 y.o. female here as requested by Suzy Bouchard, PA* for migraine. She has had them since 2012 since depo and stayed on it and it was too long. After that she would get them every once in a while, didn;t like light or noises. But in 2018 is when they started coming more frequent and symptoms started changing after she moved to [facility], she was having weakness on the left side, later in 2018 it "hit her" she blacked out, she lost consciousness. She was started on medications. She would have to go to the hospital and get IV medications. Last year they started worsening, would last 4-5 days, they would start on the right side, anything could trigger it, would start unilaterally, pounding/punding/throbbing, light and sound senitivity, she had to go to the ED and she was in the waiting area for so long. It's been awful. Severe. Lasts 24-72 hours or  longer, nausea, movement makes it worse. She just got the propranolol in January not helping. Amitriptyline not helping. Maxalt may help, she has to take a second dose. At least 1/2 the month of migraines. She is trying to get pregnant. She declines botox. 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 No aura. No medication overuse. No other focal neurologic deficits, associated symptoms, inciting events or modifiable factors.  Reviewed notes, labs and imaging from outside physicians, which showed:  preg urine 01/20/2020: negative. Bmp unremrkable 07/05/2019.   CTA H/N 07/05/2019: reviewed CTA h/N report and images and agree as below CT HEAD FINDINGS: personally reviewed images and agree  Brain: No evidence of acute infarction, hemorrhage, hydrocephalus, extra-axial collection or mass lesion/mass effect.  Vascular: No hyperdense vessel or unexpected calcification.  Skull: Normal. Negative for fracture or focal lesion.  Sinuses: Imaged portions are clear.  Orbits: No acute finding.  Review of the MIP images confirms the above findings  CTA NECK FINDINGS  Aortic arch: There is common origin of the innominate and left common carotid artery. Imaged portion shows no evidence of aneurysm or dissection. No significant stenosis of the major arch vessel origins.  Right carotid system: No evidence of dissection, stenosis (50% or greater) or occlusion.  Left carotid system: No evidence of dissection, stenosis (50% or greater) or occlusion.  Vertebral arteries: Codominant. No evidence of dissection, stenosis (50% or greater) or occlusion.  Skeleton: Negative  Other neck: Negative  Upper chest: Unremarkable  Review of the MIP images confirms the above  findings  CTA HEAD FINDINGS  Anterior circulation: No significant stenosis, proximal occlusion, aneurysm, or vascular malformation.  Posterior circulation: No significant stenosis, proximal occlusion, aneurysm, or vascular  malformation.  Venous sinuses: As permitted by contrast timing, patent.  Anatomic variants: No clinically of relevant anatomical variations.  Review of the MIP images confirms the above findings  IMPRESSION: 1. Normal brain. 2. Normal CTA of the neck.  MRI brain 07/05/2019:personally reviewed images and agree with following  FINDINGS: The examination is severely degraded by susceptibility artifacts from the patient's dental braces. Diffusion-weighted imaging is completely nondiagnostic. The FLAIR sequence is moderately degraded but the visualized portion of the brain is normal.  IMPRESSION: Severely compromised study due to susceptibility effects from orthodontic hardware. CT perfusion scan would be a possible alternative, if clinically indicated.   Observations/Objective:  Generalized: Well developed, in no acute distress  Mentation: Alert oriented to time, place, history taking. Follows all commands speech and language fluent   Assessment and Plan:  35 y.o. year old female  has a past medical history of Arthritic-like pain, Asthma, Migraine, and Pancreatitis. here with  No diagnosis found.  No orders of the defined types were placed in this encounter.   No orders of the defined types were placed in this encounter.    Follow Up Instructions:  I discussed the assessment and treatment plan with the patient. The patient was provided an opportunity to ask questions and all were answered. The patient agreed with the plan and demonstrated an understanding of the instructions.   The patient was advised to call back or seek an in-person evaluation if the symptoms worsen or if the condition fails to improve as anticipated.  I provided *** minutes of non-face-to-face time during this encounter. Patient located at their place of residence during [place] visit. Provider is in the office.    Debbora Presto, NP

## 2021-03-13 ENCOUNTER — Other Ambulatory Visit: Payer: Self-pay

## 2021-03-13 DIAGNOSIS — J45909 Unspecified asthma, uncomplicated: Secondary | ICD-10-CM | POA: Diagnosis not present

## 2021-03-13 DIAGNOSIS — R079 Chest pain, unspecified: Secondary | ICD-10-CM | POA: Diagnosis present

## 2021-03-13 DIAGNOSIS — R0789 Other chest pain: Secondary | ICD-10-CM | POA: Insufficient documentation

## 2021-03-13 DIAGNOSIS — Z7951 Long term (current) use of inhaled steroids: Secondary | ICD-10-CM | POA: Insufficient documentation

## 2021-03-14 ENCOUNTER — Other Ambulatory Visit: Payer: Self-pay

## 2021-03-14 ENCOUNTER — Emergency Department (HOSPITAL_BASED_OUTPATIENT_CLINIC_OR_DEPARTMENT_OTHER): Payer: Medicaid Other

## 2021-03-14 ENCOUNTER — Encounter (HOSPITAL_BASED_OUTPATIENT_CLINIC_OR_DEPARTMENT_OTHER): Payer: Self-pay | Admitting: *Deleted

## 2021-03-14 ENCOUNTER — Emergency Department (HOSPITAL_BASED_OUTPATIENT_CLINIC_OR_DEPARTMENT_OTHER)
Admission: EM | Admit: 2021-03-14 | Discharge: 2021-03-14 | Disposition: A | Discharge status: Home / Self Care | Payer: Medicaid Other | Attending: Emergency Medicine | Admitting: Emergency Medicine

## 2021-03-14 DIAGNOSIS — R0789 Other chest pain: Secondary | ICD-10-CM

## 2021-03-14 NOTE — ED Provider Notes (Signed)
[facility] DEPARTMENT Provider Note  CSN: [id number] Arrival date & time: 03/13/21 2354  Chief Complaint(s) Chest Pain  HPI Kendra Graham is a 35 y.o. female    Chest Pain Pain location:  L chest Pain quality: sharp, shooting and stabbing   Pain radiates to:  Does not radiate Pain severity:  Moderate Onset quality:  Sudden Duration:  6 hours Progression:  Unchanged Chronicity:  New Context: movement   Relieved by: being still. Worsened by:  Certain positions, movement and deep breathing Associated symptoms: no anxiety, no back pain, no cough, no diaphoresis, no nausea, no shortness of breath and no vomiting    Past Medical History Past Medical History:  Diagnosis Date   Arthritic-like pain    Asthma    Migraine    Pancreatitis    Patient Active Problem List   Diagnosis Date Noted   Chronic migraine without aura without status migrainosus, not intractable 02/05/2020   Home Medication(s) Prior to Admission medications   Medication Sig Start Date End Date Taking? Authorizing Provider  albuterol (VENTOLIN HFA) 108 (90 Base) MCG/ACT inhaler Inhale 2 puffs into the lungs every 6 (six) hours as needed for wheezing or shortness of breath. 06/21/20   Sharion Balloon, FNP  amitriptyline (ELAVIL) 100 MG tablet Take 100 mg by mouth at bedtime. 05/28/19   [provider]  benzonatate (TESSALON PERLES) 100 MG capsule Take 1 capsule (100 mg total) by mouth 3 (three) times daily as needed. 06/21/20   Sharion Balloon, FNP  fluticasone (FLONASE) 50 MCG/ACT nasal spray Place 2 sprays into both nostrils daily. 06/21/20   Evelina Dun A, FNP  ibuprofen (ADVIL,MOTRIN) 800 MG tablet Take 1 tablet (800 mg total) by mouth every 8 (eight) hours as needed. 05/27/18   Petrucelli, Samantha R, PA-C  metoCLOPramide (REGLAN) 10 MG tablet Take up to 4x a day for migraine. May take with Rizatriptan/maxalt. 02/04/20   Melvenia Beam, MD  ondansetron (ZOFRAN-ODT) 8 MG  disintegrating tablet Take 1 tablet (8 mg total) by mouth every 8 (eight) hours as needed for nausea. 06/22/20   McVey, Gelene Mink, PA-C  propranolol ER (INDERAL LA) 120 MG 24 hr capsule Take 1 capsule (120 mg total) by mouth daily. 02/04/20   Melvenia Beam, MD  rizatriptan (MAXALT-MLT) 10 MG disintegrating tablet Take 1 tablet (10 mg total) by mouth as needed for migraine. May repeat in 2 hours if needed 02/04/20   Melvenia Beam, MD                                                                                                                                    Past Surgical History Past Surgical History:  Procedure Laterality Date   NO PAST SURGERIES     Family History Family History  Problem Relation Age of Onset   Diabetes Mother    Hyperlipidemia Mother    Hypertension Mother  Cancer Father    Cancer Maternal Grandmother    Migraines Sister     Social History Social History   Tobacco Use   Smoking status: Never   Smokeless tobacco: Never  Vaping Use   Vaping Use: Never used  Substance Use Topics   Alcohol use: Yes    Comment: social   Drug use: No   Allergies Decadron [dexamethasone] and Vinyl ether  Review of Systems Review of Systems  Constitutional:  Negative for diaphoresis.  Respiratory:  Negative for cough and shortness of breath.   Cardiovascular:  Positive for chest pain.  Gastrointestinal:  Negative for nausea and vomiting.  Musculoskeletal:  Negative for back pain.  All other systems are reviewed and are negative for acute change except as noted in the HPI  Physical Exam Vital Signs  I have reviewed the triage vital signs BP (!) 134/97 (BP Location: Right Arm)   Pulse 79   Temp 98.5 F (36.9 C) (Oral)   Resp 18   Ht 5\' 3"  (1.6 m)   Wt 62.1 kg   LMP 02/21/2021   SpO2 100%   BMI 24.27 kg/m   Physical Exam Vitals reviewed.  Constitutional:      General: She is not in acute distress.    Appearance: She is well-developed. She is not  diaphoretic.  HENT:     Head: Normocephalic and atraumatic.     Nose: Nose normal.  Eyes:     General: No scleral icterus.       Right eye: No discharge.        Left eye: No discharge.     Conjunctiva/sclera: Conjunctivae normal.     Pupils: Pupils are equal, round, and reactive to light.  Cardiovascular:     Rate and Rhythm: Normal rate and regular rhythm.     Heart sounds: No murmur heard.   No friction rub. No gallop.  Pulmonary:     Effort: Pulmonary effort is normal. No respiratory distress.     Breath sounds: Normal breath sounds. No stridor. No rales.  Chest:     Chest wall: Tenderness present.    Abdominal:     General: There is no distension.     Palpations: Abdomen is soft.     Tenderness: There is no abdominal tenderness.  Musculoskeletal:        General: No tenderness.     Cervical back: Normal range of motion and neck supple.  Skin:    General: Skin is warm and dry.     Findings: No erythema or rash.  Neurological:     Mental Status: She is alert and oriented to person, place, and time.    ED Results and Treatments Labs (all labs ordered are listed, but only abnormal results are displayed) Labs Reviewed - No data to display  EKG  EKG Interpretation  Date/Time:  Wednesday March 14 2021 00:04:40 EDT Ventricular Rate:  75 PR Interval:  148 QRS Duration: 66 QT Interval:  362 QTC Calculation: 404 R Axis:   90 Text Interpretation: Normal sinus rhythm Rightward axis Nonspecific T wave abnormality Abnormal ECG Otherwise no significant change Confirmed by Addison Lank [phone]) on 03/14/2021 2:36:21 AM       Radiology DG Chest 2 View  Result Date: 03/14/2021 CLINICAL DATA:  Left-sided chest pain. EXAM: CHEST - 2 VIEW COMPARISON:  None. FINDINGS: The heart size and mediastinal contours are within normal limits. Both lungs are clear. The  visualized skeletal structures are unremarkable. IMPRESSION: No active cardiopulmonary disease. Electronically Signed   By: Virgina Norfolk M.D.   On: 03/14/2021 02:55    Pertinent labs & imaging results that were available during my care of the patient were reviewed by me and considered in my medical decision making (see MDM for details).  Medications Ordered in ED Medications - No data to display                                                                                                                                   Procedures Procedures  (including critical care time)  Medical Decision Making / ED Course I have reviewed the nursing notes for this encounter and the patient's prior records (if available in EHR or on provided paperwork).  Kendra Graham was evaluated in Emergency Department on 03/14/2021 for the symptoms described in the history of present illness. She was evaluated in the context of the global COVID-19 pandemic, which necessitated consideration that the patient might be at risk for infection with the SARS-CoV-2 virus that causes COVID-19. Institutional protocols and algorithms that pertain to the evaluation of patients at risk for COVID-19 are in a state of rapid change based on information released by regulatory bodies including the CDC and federal and state organizations. These policies and algorithms were followed during the patient's care in the ED.     EKG without acute ischemic changes or evidence of pericarditis. Chest x-ray without evidence suggestive of pneumonia, pneumothorax, pneumomediastinum.  No abnormal contour of the mediastinum to suggest dissection. No evidence of acute injuries.  Presentation most concerning for MSK etiology. Doubt ACS. Low suspicion for PE and patient is PERC negative. Not classic for aortic dissection or esophageal perforation.  Pertinent labs & imaging results that were available during my care of the patient were reviewed by  me and considered in my medical decision making:    Final Clinical Impression(s) / ED Diagnoses Final diagnoses:  Chest discomfort  Chest wall pain   The patient appears reasonably screened and/or stabilized for discharge and I doubt any other medical condition or other [facility] requiring further screening, evaluation, or treatment in the ED at this time prior to discharge. Safe for discharge with strict return precautions.  Disposition: Discharge  Condition: Good  I  have discussed the results, Dx and Tx plan with the patient/family who expressed understanding and agree(s) with the plan. Discharge instructions discussed at length. The patient/family was given strict return precautions who verbalized understanding of the instructions. No further questions at time of discharge.    ED Discharge Orders     None       Follow Up: Primary care provider  Call  to schedule an appointment for close follow up    This chart was dictated using voice recognition software.  Despite best efforts to proofread,  errors can occur which can change the documentation meaning.    Fatima Blank, MD 03/14/21 [phone]

## 2021-03-14 NOTE — ED Triage Notes (Signed)
C/o left sided chest pain with movt x 6 hrs

## 2022-01-24 ENCOUNTER — Emergency Department (HOSPITAL_BASED_OUTPATIENT_CLINIC_OR_DEPARTMENT_OTHER): Payer: Medicaid Other

## 2022-01-24 ENCOUNTER — Encounter (HOSPITAL_BASED_OUTPATIENT_CLINIC_OR_DEPARTMENT_OTHER): Payer: Self-pay | Admitting: Emergency Medicine

## 2022-01-24 ENCOUNTER — Other Ambulatory Visit: Payer: Self-pay

## 2022-01-24 ENCOUNTER — Emergency Department (HOSPITAL_BASED_OUTPATIENT_CLINIC_OR_DEPARTMENT_OTHER)
Admission: EM | Admit: 2022-01-24 | Discharge: 2022-01-24 | Disposition: A | Discharge status: Home / Self Care | Payer: Medicaid Other | Attending: Emergency Medicine | Admitting: Emergency Medicine

## 2022-01-24 DIAGNOSIS — R109 Unspecified abdominal pain: Secondary | ICD-10-CM

## 2022-01-24 DIAGNOSIS — N9489 Other specified conditions associated with female genital organs and menstrual cycle: Secondary | ICD-10-CM | POA: Diagnosis not present

## 2022-01-24 DIAGNOSIS — Z3A01 Less than 8 weeks gestation of pregnancy: Secondary | ICD-10-CM | POA: Diagnosis not present

## 2022-01-24 DIAGNOSIS — D259 Leiomyoma of uterus, unspecified: Secondary | ICD-10-CM

## 2022-01-24 DIAGNOSIS — O26891 Other specified pregnancy related conditions, first trimester: Secondary | ICD-10-CM | POA: Diagnosis present

## 2022-01-24 DIAGNOSIS — O3411 Maternal care for benign tumor of corpus uteri, first trimester: Secondary | ICD-10-CM | POA: Diagnosis not present

## 2022-01-24 LAB — COMPREHENSIVE METABOLIC PANEL
ALT: 10 U/L (ref 0–44)
AST: 16 U/L (ref 15–41)
Albumin: 4.4 g/dL (ref 3.5–5.0)
Alkaline Phosphatase: 45 U/L (ref 38–126)
Anion gap: 8 (ref 5–15)
BUN: 7 mg/dL (ref 6–20)
CO2: 24 mmol/L (ref 22–32)
Calcium: 9.1 mg/dL (ref 8.9–10.3)
Chloride: 107 mmol/L (ref 98–111)
Creatinine, Ser: 0.74 mg/dL (ref 0.44–1.00)
GFR, Estimated: >60 mL/min (ref >60)
Glucose, Bld: 106 mg/dL — ABNORMAL HIGH (ref 70–99)
Potassium: 4 mmol/L (ref 3.5–5.1)
Sodium: 139 mmol/L (ref 135–145)
Total Bilirubin: 0.5 mg/dL (ref 0.3–1.2)
Total Protein: 7.7 g/dL (ref 6.5–8.1)

## 2022-01-24 LAB — URINALYSIS, ROUTINE W REFLEX MICROSCOPIC
Bilirubin Urine: NEGATIVE
Glucose, UA: NEGATIVE mg/dL
Hgb urine dipstick: NEGATIVE
Ketones, ur: 15 mg/dL — AB
Leukocytes,Ua: NEGATIVE
Nitrite: NEGATIVE
Specific Gravity, Urine: 1.028 (ref 1.005–1.030)
pH: 6.5 (ref 5.0–8.0)

## 2022-01-24 LAB — CBC
HCT: 37.7 % (ref 36.0–46.0)
Hemoglobin: 12.2 g/dL (ref 12.0–15.0)
MCH: 27.9 pg (ref 26.0–34.0)
MCHC: 32.4 g/dL (ref 30.0–36.0)
MCV: 86.3 fL (ref 80.0–100.0)
Platelets: 226 10*3/uL (ref 150–400)
RBC: 4.37 MIL/uL (ref 3.87–5.11)
RDW: 14.2 % (ref 11.5–15.5)
WBC: 5.1 10*3/uL (ref 4.0–10.5)
nRBC: 0 % (ref 0.0–0.2)

## 2022-01-24 LAB — HCG, QUANTITATIVE, PREGNANCY: hCG, Beta Chain, Quant, S: 1898 m[IU]/mL — ABNORMAL HIGH (ref <5)

## 2022-01-24 LAB — LIPASE, BLOOD: Lipase: 39 U/L (ref 11–51)

## 2022-01-24 LAB — PREGNANCY, URINE: Preg Test, Ur: POSITIVE — AB

## 2022-01-24 NOTE — ED Triage Notes (Signed)
Pt via pov from home with generalized abdominal pain x 2 days. She reports it is worse at night and after eating. Denies n/v/d. Denies urinary symptoms as well. Pt alert & oriented, nad noted.

## 2022-01-24 NOTE — ED Notes (Signed)
Discharge paperwork given and verbally understood. 

## 2022-01-24 NOTE — Discharge Instructions (Signed)
Please call your OB/GYN office to schedule follow-up appointment.  Your ultrasound and blood test showed that you have a very early pregnancy.  This is likely 5 weeks or less by dating.  You will need to follow-up with your OB/GYN office this week if possible for repeat blood work.  You should avoid taking any additional medicines aside from Tylenol for pain.  You should also avoid drinking, smoking, or any use of recreational drugs, all of which can be dangerous to her pregnancy.  We talked about the possibility that this may be a threatened miscarriage.  Your abdominal pain may be your body preparing to have a miscarriage.  Please take the time to read over the attached instructions for what to expect with a threatened miscarriage.

## 2022-01-24 NOTE — ED Provider Notes (Signed)
[facility] EMERGENCY DEPT Provider Note   CSN: [id number] Arrival date & time: 01/24/22  1151     History  Chief Complaint  Patient presents with   Abdominal Pain    Kendra Graham is a 36 y.o. female presented to ED with lower abdominal pain for 2 days.  She reports she has had a sharp and intermittently cramping pain all across the lower abdomen.  She denies vaginal bleeding or discharge.  Denies diarrhea or constipation.  Reports mild nausea denies vomiting.  HPI     Home Medications Prior to Admission medications   Medication Sig Start Date End Date Taking? Authorizing Provider  albuterol (VENTOLIN HFA) 108 (90 Base) MCG/ACT inhaler Inhale 2 puffs into the lungs every 6 (six) hours as needed for wheezing or shortness of breath. 06/21/20   Sharion Balloon, FNP  amitriptyline (ELAVIL) 100 MG tablet Take 100 mg by mouth at bedtime. 05/28/19   [provider]  benzonatate (TESSALON PERLES) 100 MG capsule Take 1 capsule (100 mg total) by mouth 3 (three) times daily as needed. 06/21/20   Sharion Balloon, FNP  fluticasone (FLONASE) 50 MCG/ACT nasal spray Place 2 sprays into both nostrils daily. 06/21/20   Evelina Dun A, FNP  ibuprofen (ADVIL,MOTRIN) 800 MG tablet Take 1 tablet (800 mg total) by mouth every 8 (eight) hours as needed. 05/27/18   Petrucelli, Samantha R, PA-C  metoCLOPramide (REGLAN) 10 MG tablet Take up to 4x a day for migraine. May take with Rizatriptan/maxalt. 02/04/20   Melvenia Beam, MD  ondansetron (ZOFRAN-ODT) 8 MG disintegrating tablet Take 1 tablet (8 mg total) by mouth every 8 (eight) hours as needed for nausea. 06/22/20   McVey, Gelene Mink, PA-C  propranolol ER (INDERAL LA) 120 MG 24 hr capsule Take 1 capsule (120 mg total) by mouth daily. 02/04/20   Melvenia Beam, MD  rizatriptan (MAXALT-MLT) 10 MG disintegrating tablet Take 1 tablet (10 mg total) by mouth as needed for migraine. May repeat in 2 hours if needed 02/04/20   Melvenia Beam,  MD      Allergies    Decadron [dexamethasone], Strawberry (diagnostic), and Vinyl ether    Review of Systems   Review of Systems  Physical Exam Updated Vital Signs BP 126/83   Pulse 74   Temp 98.2 F (36.8 C) (Oral)   Resp 16   Ht '5\' 3"'$  (1.6 m)   Wt 59.9 kg   LMP 12/28/2021 (Exact Date)   SpO2 100%   BMI 23.38 kg/m  Physical Exam Constitutional:      General: She is not in acute distress. HENT:     Head: Normocephalic and atraumatic.  Eyes:     Conjunctiva/sclera: Conjunctivae normal.     Pupils: Pupils are equal, round, and reactive to light.  Cardiovascular:     Rate and Rhythm: Normal rate and regular rhythm.  Pulmonary:     Effort: Pulmonary effort is normal. No respiratory distress.  Abdominal:     General: There is no distension.     Tenderness: There is no abdominal tenderness.  Skin:    General: Skin is warm and dry.  Neurological:     General: No focal deficit present.     Mental Status: She is alert. Mental status is at baseline.  Psychiatric:        Mood and Affect: Mood normal.        Behavior: Behavior normal.     ED Results / Procedures / Treatments  Labs (all labs ordered are listed, but only abnormal results are displayed) Labs Reviewed  COMPREHENSIVE METABOLIC PANEL - Abnormal; Notable for the following components:      Result Value   Glucose, Bld 106 (*)    All other components within normal limits  URINALYSIS, ROUTINE W REFLEX MICROSCOPIC - Abnormal; Notable for the following components:   Ketones, ur 15 (*)    Protein, ur TRACE (*)    All other components within normal limits  PREGNANCY, URINE - Abnormal; Notable for the following components:   Preg Test, Ur POSITIVE (*)    All other components within normal limits  HCG, QUANTITATIVE, PREGNANCY - Abnormal; Notable for the following components:   hCG, Beta Chain, Quant, S 1,898 (*)    All other components within normal limits  LIPASE, BLOOD  CBC    EKG None  Radiology US OB  LESS THAN 14 WEEKS WITH OB TRANSVAGINAL  Result Date: 01/24/2022 CLINICAL DATA:  Generalized abdominal pain for 2 days worsened after eating, first trimester pregnancy, LMP 12/28/2021, G2P1 Caesarean section EXAM: OBSTETRIC <14 WK [place] AND TRANSVAGINAL OB US TECHNIQUE: Both transabdominal and transvaginal ultrasound examinations were performed for complete evaluation of the gestation as well as the maternal uterus, adnexal regions, and pelvic cul-de-sac. Transvaginal technique was performed to assess early pregnancy. COMPARISON:  None Available. FINDINGS: Intrauterine gestational sac: Present, single, tiny Yolk sac:  Not identified Embryo:  Not identified Cardiac Activity: N/A Heart Rate: N/A  bpm MSD: 2.9 mm   5 w   0 d CRL:    mm    w    d                  [place] EDC: Subchorionic hemorrhage:  None visualized. Maternal uterus/adnexae: Two uterine leiomyomata are identified, both subserosal, 3.7 x 3.0 x 2.8 cm superiorly and 3.5 x 3.0 x 3.3 cm more inferiorly RIGHT ovary normal size and morphology, 2.7 x 3.7 x 2.8 cm. LEFT ovary normal size and morphology, 1.6 x 3.0 x 1.7 cm. No free pelvic fluid or adnexal masses. IMPRESSION: Tiny gestational sac within the uterus with mean sac diameter corresponding to 5 weeks 0 days EGA. No fetal pole is identified to establish viability; may consider follow-up ultrasound in 14 days to establish viability if clinically indicated. Two subserosal uterine leiomyomata measuring 3.7 cm and 3.5 cm in greatest dimensions. Electronically Signed   By: Lavonia Dana M.D.   On: 01/24/2022 14:10    Procedures Procedures    Medications Ordered in ED Medications - No data to display  ED Course/ Medical Decision Making/ A&P Clinical Course as of 01/24/22 1427  Thu Jan 24, 2022  1423 Discussed ultrasound findings with patient, she has her own OB she will follow-up with.  Both ultrasound hormone levels correlate with very early pregnancy.  I also discussed the possibility of an impending or  threatened miscarriage.  Okay for discharge otherwise. [MT]    Clinical Course User Index [MT] Vinnie Gombert, Carola Rhine, MD   She was also made aware of uterine fibroids noted on ultrasound                          Medical Decision Making Amount and/or Complexity of Data Reviewed Labs: ordered. Radiology: ordered.   This patient presents to the ED with concern for lower abdominal pain. This involves an extensive number of treatment options, and is a complaint that carries with it a high  risk of complications and morbidity.  The differential diagnosis includes pelvic pain versus constipation versus ureteral colic versus UTI versus pregnancy complication versus other  I ordered and personally interpreted labs.  The pertinent results include: Urine pregnancy positive.  Remainder of labs are within normal limits.  No leukocytosis or clear evidence of UTI.  The patient was unaware that she was pregnant, but reports her last menstrual period ended August 11.  She has no prior history of miscarriages or abortions.  She has 1 living child and reports she was pregnant about 10 years ago.  Today, she may be too early for IUP visualization, but I think a pelvic ultrasound be reasonable given her cramping pain in terms of an ectopic evaluation, correlated with an hCG level as well.  I also explained her the possibility that she is preparing to miscarry, which can happen spontaneously in many early pregnancies.  She does an OB/GYN provider she can follow-up with.  I ordered imaging studies including pelvic ultrasound (for ectopic work-up and rule out) I independently visualized and interpreted imaging which showed gestational sac within the IUP, likely too early for dating, and Leio myomas noted I agree with the radiologist interpretation  I have reviewed the patients home medicines and have made adjustments as needed  Test Considered: Lower suspicion for acute appendicitis based on his clinical presentation  and I do not feel she needs a CT scan of the abdomen  After the interventions noted above, I reevaluated the patient and found that they have: stayed the same  Dispostion:  After consideration of the diagnostic results and the patients response to treatment, I feel that the patent would benefit from outpatient OB/GYN follow-up         Final Clinical Impression(s) / ED Diagnoses Final diagnoses:  Less than [redacted] weeks gestation of pregnancy  Abdominal pain, unspecified abdominal location  Uterine leiomyoma, unspecified location    Rx / DC Orders ED Discharge Orders     None         Wyvonnia Dusky, MD 01/24/22 1428

## 2022-01-24 NOTE — ED Notes (Addendum)
Patient states that pain is intermittent. It radiates across her lower abdomen. It is sometimes dull and other times sharp. Pain is currently a 5 on a scale of 0-10

## 2022-01-24 NOTE — ED Notes (Signed)
Blood drawn for troponin lab. Patient connected to BP cuff, SPO2 and ECG for monitoring. Dropped off labs. Patient said his pain was a 5 on a scale of 5-10. He was sitting in the bed when I left.

## 2022-03-05 ENCOUNTER — Inpatient Hospital Stay (HOSPITAL_COMMUNITY)
Admission: AD | Admit: 2022-03-05 | Discharge: 2022-03-05 | Disposition: A | Discharge status: Home / Self Care | Payer: Medicaid Other | Attending: Obstetrics & Gynecology | Admitting: Obstetrics & Gynecology

## 2022-03-05 ENCOUNTER — Encounter (HOSPITAL_COMMUNITY): Payer: Self-pay

## 2022-03-05 DIAGNOSIS — R1032 Left lower quadrant pain: Secondary | ICD-10-CM | POA: Insufficient documentation

## 2022-03-05 DIAGNOSIS — N949 Unspecified condition associated with female genital organs and menstrual cycle: Secondary | ICD-10-CM | POA: Diagnosis present

## 2022-03-05 DIAGNOSIS — O26891 Other specified pregnancy related conditions, first trimester: Secondary | ICD-10-CM | POA: Insufficient documentation

## 2022-03-05 DIAGNOSIS — Z3A09 9 weeks gestation of pregnancy: Secondary | ICD-10-CM

## 2022-03-05 LAB — URINALYSIS, ROUTINE W REFLEX MICROSCOPIC
Bilirubin Urine: NEGATIVE
Glucose, UA: NEGATIVE mg/dL
Hgb urine dipstick: NEGATIVE
Ketones, ur: NEGATIVE mg/dL
Leukocytes,Ua: NEGATIVE
Nitrite: NEGATIVE
Protein, ur: NEGATIVE mg/dL
Specific Gravity, Urine: 1.013 (ref 1.005–1.030)
pH: 5 (ref 5.0–8.0)

## 2022-03-05 LAB — WET PREP, GENITAL
Sperm: NONE SEEN
Trich, Wet Prep: NONE SEEN
WBC, Wet Prep HPF POC: <10 (ref <10)
Yeast Wet Prep HPF POC: NONE SEEN

## 2022-03-05 LAB — CBC WITH DIFFERENTIAL/PLATELET
Abs Immature Granulocytes: 0.01 10*3/uL (ref 0.00–0.07)
Basophils Absolute: 0 10*3/uL (ref 0.0–0.1)
Basophils Relative: 1 %
Eosinophils Absolute: 0 10*3/uL (ref 0.0–0.5)
Eosinophils Relative: 0 %
HCT: 38.5 % (ref 36.0–46.0)
Hemoglobin: 12.4 g/dL (ref 12.0–15.0)
Immature Granulocytes: 0 %
Lymphocytes Relative: 28 %
Lymphs Abs: 1.6 10*3/uL (ref 0.7–4.0)
MCH: 27.4 pg (ref 26.0–34.0)
MCHC: 32.2 g/dL (ref 30.0–36.0)
MCV: 85 fL (ref 80.0–100.0)
Monocytes Absolute: 0.6 10*3/uL (ref 0.1–1.0)
Monocytes Relative: 10 %
Neutro Abs: 3.6 10*3/uL (ref 1.7–7.7)
Neutrophils Relative %: 61 %
Platelets: 210 10*3/uL (ref 150–400)
RBC: 4.53 MIL/uL (ref 3.87–5.11)
RDW: 14 % (ref 11.5–15.5)
WBC: 5.9 10*3/uL (ref 4.0–10.5)
nRBC: 0 % (ref 0.0–0.2)

## 2022-03-05 MED ORDER — ACETAMINOPHEN 500 MG PO TABS
1000.0000 mg | ORAL_TABLET | Freq: Once | ORAL | Status: AC
  Administered 2022-03-05: 1000 mg via ORAL
  Filled 2022-03-05: qty 2

## 2022-03-05 MED ORDER — CYCLOBENZAPRINE HCL 5 MG PO TABS: 5.0000 mg | ORAL_TABLET | Freq: Three times a day (TID) | ORAL | 0 refills | Status: AC | PRN

## 2022-03-05 MED ORDER — CYCLOBENZAPRINE HCL 5 MG PO TABS
10.0000 mg | ORAL_TABLET | Freq: Once | ORAL | Status: AC
  Administered 2022-03-05: 10 mg via ORAL
  Filled 2022-03-05: qty 2

## 2022-03-05 NOTE — Discharge Instructions (Signed)
Round Ligament Massage & Stretches  Massage: Starting at the middle of your pubic bone, trace little circles in a wide U from your pubic bone to your hip bones on both sides.  Then starting just above your pubic bone, press in and down, alternating sides to create a gentle rocking of your uterus back and forth.  Move your hands up the sides of your belly and back down. Do this 3-5 times upon waking and before bed.  Stretches: Get on hands and knees and alternate arching your back deeply while inhaling, and then rounding your back while exhaling. Modified runners lunge:  - Sit on a chair with half of your bottom on the chair and half off.  - Sit up tall, plant your front foot, and stretch your other foot out behind you.  - Breathe deeply for 5 breaths and then do the other side.     PREGNANCY SUPPORT BELT: You are not alone, Seventy-five percent of women have some sort of abdominal or back pain at some point in their pregnancy. Your baby is growing at a fast pace, which means that your whole body is rapidly trying to adjust to the changes. As your uterus grows, your back may start feeling a bit under stress and this can result in back or abdominal pain that can go from mild, and therefore bearable, to severe pains that will not allow you to sit or lay down comfortably, When it comes to dealing with pregnancy-related pains and cramps, some pregnant women usually prefer natural remedies, which the market is filled with nowadays. For example, wearing a pregnancy support belt can help ease and lessen your discomfort and pain. WHAT ARE THE BENEFITS OF WEARING A PREGNANCY SUPPORT BELT? A pregnancy support belt provides support to the lower portion of the belly taking some of the weight of the growing uterus and distributing to the other parts of your body. It is designed make you comfortable and gives you extra support. Over the years, the pregnancy apparel market has been studying the needs and wants  of pregnant women and they have come up with the most comfortable pregnancy support belts that woman could ever ask for. In fact, you will no longer have to wear a stretched-out or bulky pregnancy belt that is visible underneath your clothes and makes you feel even more uncomfortable. Nowadays, a pregnancy support belt is made of comfortable and stretchy materials that will not irritate your skin but will actually make you feel at ease and you will not even notice you are wearing it. They are easy to put on and adjust during the day and can be worn at night for additional support.  BENEFITS: Relives Back pain Relieves Abdominal Muscle and Leg Pain Stabilizes the Pelvic Ring Offers a Cushioned Abdominal Lift Pad Relieves pressure on the Sciatic Nerve Within Minutes WHERE TO GET YOUR PREGNANCY BELT: [employer] [phone]'$  [place], [place] [address]  [facility]  [address] [place], [place] [address]  [phone]  [facility]  [place], [place] [address]  [phone]  [employer]  [address] [place], [place] [address]  [phone]  [employer]  [address] [place], [place] [address]  [phone]

## 2022-03-05 NOTE — MAU Provider Note (Signed)
History     CSN: [id number]  Arrival date and time: 03/05/22 0844   Event Date/Time   First Provider Initiated Contact with Patient 03/05/22 [phone]      Chief Complaint  Patient presents with   Abdominal Pain   Patient here today for left abdominal pain. Pain started 3 days ago and originally transferred across width of lower abdomen but is not isolated to left side. It improves when she lifts and holds her stomach up against gravity. She has not taken tylenol to assist with the pain. She denies urinary urgency, irritation, or frank hematuria. She denies flank pain and fever. She has had intermittent nausea and no vomiting. She was last pregnant 10 years ago. She has a confirmed IUP on [place] from Urgent Care last week where she was seen for a similar complaint.     OB History     Gravida  2   Para  1   Term  1   Preterm      AB      Living  1      SAB      IAB      Ectopic      Multiple      Live Births  1           Past Medical History:  Diagnosis Date   Arthritic-like pain    Asthma    Migraine    Pancreatitis     Past Surgical History:  Procedure Laterality Date   NO PAST SURGERIES     WISDOM TOOTH EXTRACTION      Family History  Problem Relation Age of Onset   Diabetes Mother    Hyperlipidemia Mother    Hypertension Mother    Cancer Father    Cancer Maternal Grandmother    Migraines Sister     Social History   Tobacco Use   Smoking status: Never   Smokeless tobacco: Never  Vaping Use   Vaping Use: Never used  Substance Use Topics   Alcohol use: Not Currently    Comment: social   Drug use: No    Allergies:  Allergies  Allergen Reactions   Decadron [Dexamethasone]    Strawberry (Diagnostic)    Vinyl Ether Hives and Rash    Medications Prior to Admission  Medication Sig Dispense Refill Last Dose   albuterol (VENTOLIN HFA) 108 (90 Base) MCG/ACT inhaler Inhale 2 puffs into the lungs every 6 (six) hours as needed for wheezing  or shortness of breath. 8 g 0    amitriptyline (ELAVIL) 100 MG tablet Take 100 mg by mouth at bedtime.      benzonatate (TESSALON PERLES) 100 MG capsule Take 1 capsule (100 mg total) by mouth 3 (three) times daily as needed. 20 capsule 0    fluticasone (FLONASE) 50 MCG/ACT nasal spray Place 2 sprays into both nostrils daily. 16 g 6    ibuprofen (ADVIL,MOTRIN) 800 MG tablet Take 1 tablet (800 mg total) by mouth every 8 (eight) hours as needed. 21 tablet 0    metoCLOPramide (REGLAN) 10 MG tablet Take up to 4x a day for migraine. May take with Rizatriptan/maxalt. 15 tablet 6    ondansetron (ZOFRAN-ODT) 8 MG disintegrating tablet Take 1 tablet (8 mg total) by mouth every 8 (eight) hours as needed for nausea. 20 tablet 0    propranolol ER (INDERAL LA) 120 MG 24 hr capsule Take 1 capsule (120 mg total) by mouth daily. 90 capsule 3  rizatriptan (MAXALT-MLT) 10 MG disintegrating tablet Take 1 tablet (10 mg total) by mouth as needed for migraine. May repeat in 2 hours if needed 9 tablet 11     Review of Systems  Genitourinary:  Negative for difficulty urinating, dysuria, flank pain, frequency, menstrual problem, pelvic pain, urgency, vaginal bleeding and vaginal discharge.  Skin:  Negative for color change and rash.  Neurological:  Negative for headaches.   Physical Exam   Blood pressure 123/70, pulse 84, temperature 97.8 F (36.6 C), temperature source Oral, resp. rate 15, height '5\' 3"'$  (1.6 m), weight 57.7 kg, last menstrual period 12/28/2021, SpO2 99 %.  Physical Exam Constitutional:      General: She is not in acute distress.    Appearance: She is well-developed. She is not ill-appearing.  Abdominal:     General: Bowel sounds are normal.     Palpations: Abdomen is soft.     Hernia: No hernia is present.     Comments: Distended appropriately for gravid uterus. No tympany. No skin changes  Genitourinary:    Uterus: Normal. Enlarged.   Skin:    General: Skin is warm and dry.  Neurological:      Mental Status: She is alert.     MAU Course  Procedures    Assessment and Plan   #LLQ Abdominal Pain  Ddx Round Ligament Pain vs UTI vs. Ectopic r/o  - Confirmed IUP - Urinalysis not necessary without urinary symptoms - Likely RLP will treat with tylenol and abdominal binder for symptom management     Deloria Lair 03/05/2022, 10:05 AM

## 2022-03-05 NOTE — MAU Note (Addendum)
...  Kendra Graham is a 36 y.o. at 52w4dhere in MAU reporting: Left lower abdominal pain that began yesterday evening around 2100. She reports the pain is sharp, intermittent, and worse with movement. Denies VB. Last IC last night but reports the pain was present prior to IC. She reports she has also been experiencing occasional nausea. Does not have any medications.  [facility] Had an U/S on 9/7 and a gestational sac was seen that measured 537w0d LMP: 12/28/2021 Onset of complaint: Yesterday Pain score: /10 left lower abdomen  Lab orders placed from triage:  UA

## 2022-04-10 ENCOUNTER — Ambulatory Visit: Payer: Medicaid Other | Admitting: Emergency Medicine

## 2022-04-29 ENCOUNTER — Inpatient Hospital Stay (HOSPITAL_COMMUNITY)
Admission: AD | Admit: 2022-04-29 | Discharge: 2022-04-29 | Disposition: A | Discharge status: Home / Self Care | Payer: Medicaid Other | Attending: Family Medicine | Admitting: Family Medicine

## 2022-04-29 ENCOUNTER — Encounter (HOSPITAL_COMMUNITY): Payer: Self-pay | Admitting: Family Medicine

## 2022-04-29 DIAGNOSIS — O26899 Other specified pregnancy related conditions, unspecified trimester: Secondary | ICD-10-CM | POA: Diagnosis not present

## 2022-04-29 DIAGNOSIS — O26892 Other specified pregnancy related conditions, second trimester: Secondary | ICD-10-CM | POA: Diagnosis not present

## 2022-04-29 DIAGNOSIS — Z3A17 17 weeks gestation of pregnancy: Secondary | ICD-10-CM | POA: Insufficient documentation

## 2022-04-29 DIAGNOSIS — O99512 Diseases of the respiratory system complicating pregnancy, second trimester: Secondary | ICD-10-CM | POA: Insufficient documentation

## 2022-04-29 DIAGNOSIS — R109 Unspecified abdominal pain: Secondary | ICD-10-CM | POA: Diagnosis not present

## 2022-04-29 DIAGNOSIS — A5901 Trichomonal vulvovaginitis: Secondary | ICD-10-CM

## 2022-04-29 LAB — URINALYSIS, ROUTINE W REFLEX MICROSCOPIC
Bilirubin Urine: NEGATIVE
Glucose, UA: NEGATIVE mg/dL
Hgb urine dipstick: NEGATIVE
Ketones, ur: NEGATIVE mg/dL
Nitrite: NEGATIVE
Protein, ur: NEGATIVE mg/dL
Specific Gravity, Urine: 1.018 (ref 1.005–1.030)
pH: 7 (ref 5.0–8.0)

## 2022-04-29 LAB — WET PREP, GENITAL
Sperm: NONE SEEN
WBC, Wet Prep HPF POC: >=10 — AB (ref <10)
Yeast Wet Prep HPF POC: NONE SEEN

## 2022-04-29 MED ORDER — METRONIDAZOLE 500 MG PO TABS
2000.0000 mg | ORAL_TABLET | Freq: Once | ORAL | Status: AC
  Administered 2022-04-29: 2000 mg via ORAL
  Filled 2022-04-29: qty 4

## 2022-04-29 NOTE — MAU Provider Note (Signed)
History     CSN: [id number]  Arrival date and time: 04/29/22 0102   None     Chief Complaint  Patient presents with   Abdominal Pain   HPI Kendra Graham is a 36 y.o. G2P1001 at 47w3dwho presents to MAU for braxton hicks contractions. Patient reports braxton hicks contractions that started on Saturday and have continued throughout this evening. She reports pain comes every minute or so and lasts about 30 seconds. She rates pain 10/10. She reports she tried Tylenol and Flexeril around 4pm without any relief. She denies vaginal bleeding, discharge, itching, odor, or urinary s/s. She reports normal BM's.   [facility]   OB History     Gravida  2   Para  1   Term  1   Preterm      AB      Living  1      SAB      IAB      Ectopic      Multiple      Live Births  1           Past Medical History:  Diagnosis Date   Arthritic-like pain    Asthma    Migraine    Pancreatitis     Past Surgical History:  Procedure Laterality Date   NO PAST SURGERIES     WISDOM TOOTH EXTRACTION      Family History  Problem Relation Age of Onset   Diabetes Mother    Hyperlipidemia Mother    Hypertension Mother    Cancer Father    Migraines Sister    Lupus Sister    Cancer Maternal Grandmother     Social History   Tobacco Use   Smoking status: Never   Smokeless tobacco: Never  Vaping Use   Vaping Use: Never used  Substance Use Topics   Alcohol use: Not Currently    Comment: social   Drug use: No    Allergies:  Allergies  Allergen Reactions   Decadron [Dexamethasone]    Strawberry (Diagnostic)    Vinyl Ether Hives and Rash    No medications prior to admission.   Review of Systems  Constitutional: Negative.   Respiratory: Negative.    Cardiovascular: Negative.   Gastrointestinal:  Positive for abdominal pain.  Genitourinary: Negative.   Musculoskeletal: Negative.   Neurological:  Negative.    Physical Exam   Patient Vitals for the past 24 hrs:  BP Temp Temp src Pulse Resp SpO2  04/29/22 0310 130/84 -- -- 79 -- --  04/29/22 0130 120/73 -- -- 78 -- --  04/29/22 0124 121/69 98.3 F (36.8 C) Oral 87 16 100 %   Physical Exam Vitals and nursing note reviewed. Exam conducted with a chaperone present.  Constitutional:      General: She is not in acute distress. Cardiovascular:     Rate and Rhythm: Normal rate.  Pulmonary:     Effort: Pulmonary effort is normal.  Abdominal:     Palpations: Abdomen is soft.     Tenderness: There is abdominal tenderness in the suprapubic area. There is no guarding or rebound. Negative signs include McBurney's sign.  Genitourinary:    Comments: Blind swabs collected  Skin:    General: Skin is warm and dry.  Neurological:     General: No focal deficit present.     Mental Status: She is alert and oriented to person, place,  and time.  Psychiatric:        Mood and Affect: Affect is flat.   Dilation: Closed Effacement (%): Thick Cervical Position: Posterior Exam by:: Maryagnes Amos, CNM  FHR: 170 bpm via doppler  Results for orders placed or performed during the hospital encounter of 04/29/22 (from the past 24 hour(s))  Wet prep, genital     Status: Abnormal   Collection Time: 04/29/22  1:47 AM  Result Value Ref Range   Yeast Wet Prep HPF POC NONE SEEN NONE SEEN   Trich, Wet Prep PRESENT (A) NONE SEEN   Clue Cells Wet Prep HPF POC PRESENT (A) NONE SEEN   WBC, Wet Prep HPF POC >=10 (A) <10   Sperm NONE SEEN   Urinalysis, Routine w reflex microscopic Urine, Clean Catch     Status: Abnormal   Collection Time: 04/29/22  1:59 AM  Result Value Ref Range   Color, Urine YELLOW YELLOW   APPearance CLOUDY (A) CLEAR   Specific Gravity, Urine 1.018 1.005 - 1.030   pH 7.0 5.0 - 8.0   Glucose, UA NEGATIVE NEGATIVE mg/dL   Hgb urine dipstick NEGATIVE NEGATIVE   Bilirubin Urine NEGATIVE NEGATIVE   Ketones, ur NEGATIVE NEGATIVE  mg/dL   Protein, ur NEGATIVE NEGATIVE mg/dL   Nitrite NEGATIVE NEGATIVE   Leukocytes,Ua LARGE (A) NEGATIVE   RBC / HPF 0-5 0 - 5 RBC/hpf   WBC, UA 11-20 0 - 5 WBC/hpf   Bacteria, UA FEW (A) NONE SEEN   Squamous Epithelial / LPF 6-10 0 - 5   Mucus PRESENT     MAU Course  Procedures  MDM UA Wet prep, GC/CT  UA with some leukocytes, but negative blood or nitrites, rare bacteria. Wet prep positive for trichomonas. GC/CT pending. Patient was offered Tylenol and Flexeril for pain but declines. She does accept heating pad. 2g Flagyl PO given. Patient was instructed that her partner will also need to be treated and can do so at [facility] or at his PCP. She was instructed to abstain from unprotected intercourse for at least 7 days after both parties have been treated.    Assessment and Plan  [redacted] weeks gestation of pregnancy Abdominal pain affecting pregnancy Trichomonas  - Discharge home in stable condition - Strict return precautions. Return to MAU as needed for new/worsening symptoms - Keep OB appointment as scheduled on 12/28   Renee Harder, [place] 04/29/2022, 3:27 AM

## 2022-04-30 LAB — GC/CHLAMYDIA PROBE AMP (~~LOC~~) NOT AT ARMC
Chlamydia: NEGATIVE
Comment: NEGATIVE
Comment: NORMAL
Neisseria Gonorrhea: NEGATIVE

## 2023-03-07 IMAGING — DX DG CHEST 2V
2 series · 2 of 2 positions shown · non-contrast
Comparison: None.

CLINICAL DATA: Left-sided chest pain.

EXAM:
CHEST - 2 VIEW

[chest pa]
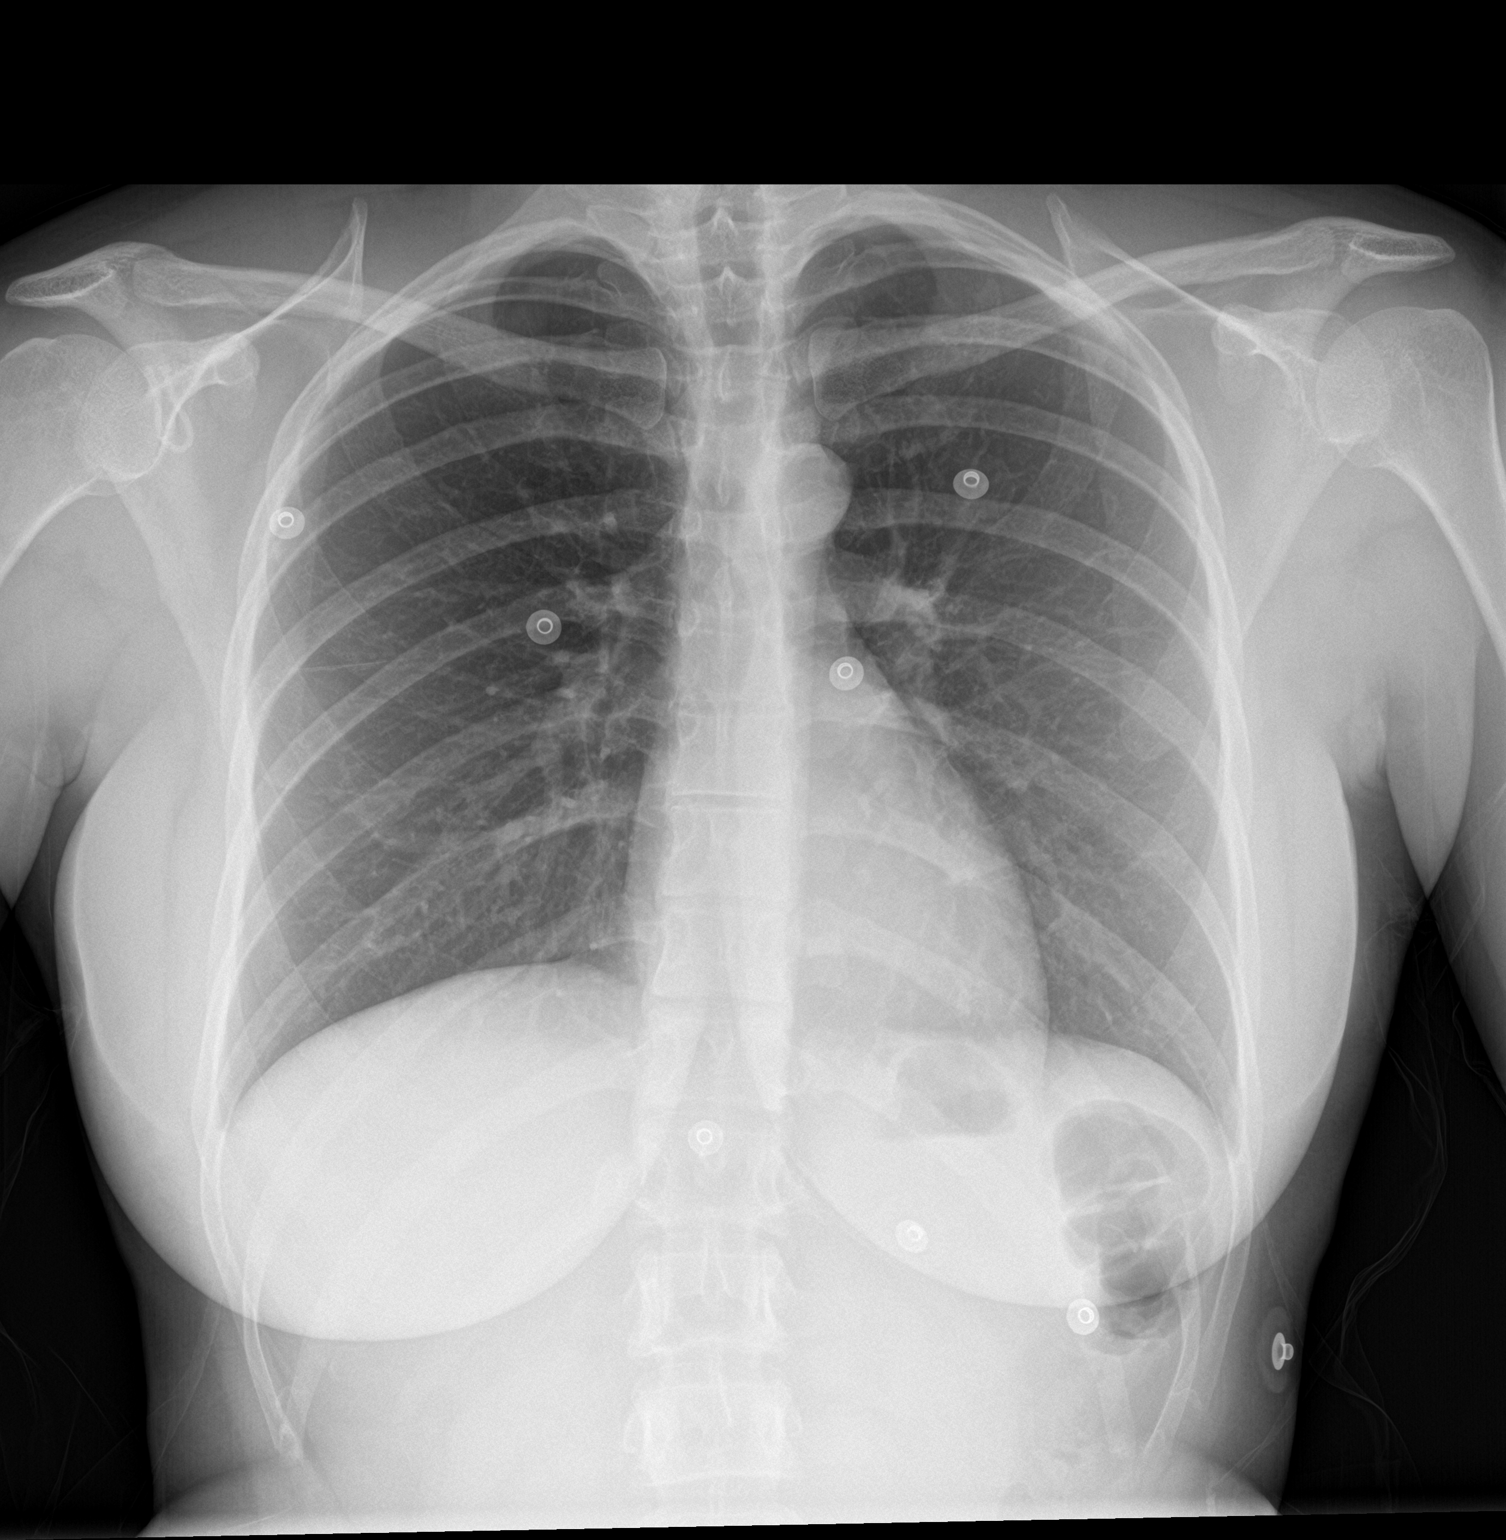

[chest lat]
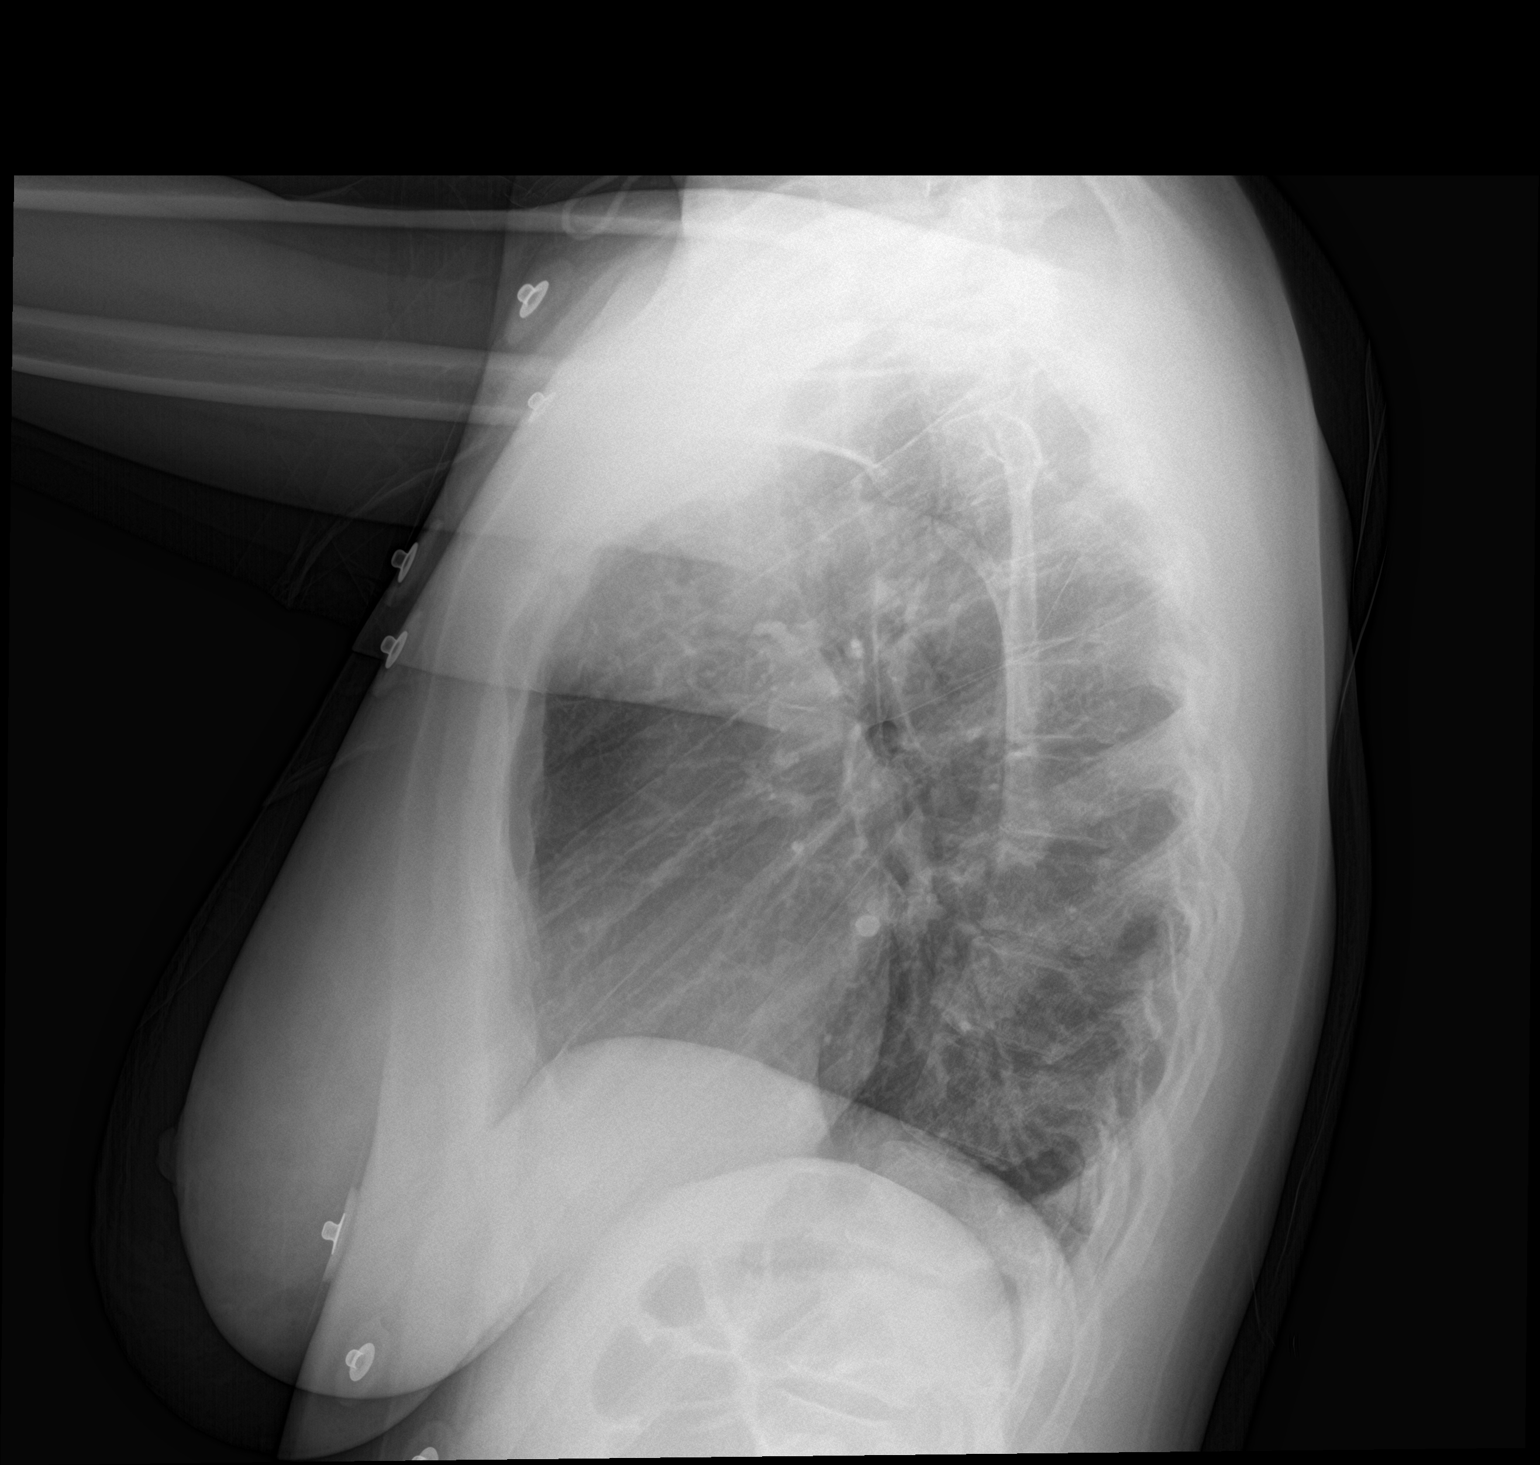

[2 of 2 positions shown; findings below may reference images not displayed]

FINDINGS: The heart size and mediastinal contours are within normal limits.
Both lungs are clear. The visualized skeletal structures are
unremarkable.
IMPRESSION: No active cardiopulmonary disease.
